# Patient Record
Sex: Female | Born: 1990 | Hispanic: Yes | Marital: Single | Smoking: Never smoker
Health system: Southern US, Community
[De-identification: ages and names within clinical notes are randomized; demographics above are authoritative.]

---

## 2012-08-10 ENCOUNTER — Ambulatory Visit: Payer: Self-pay | Admitting: Advanced Practice Midwife

## 2012-09-26 ENCOUNTER — Ambulatory Visit: Payer: Self-pay | Admitting: Family Medicine

## 2013-01-13 ENCOUNTER — Observation Stay: Payer: Self-pay

## 2013-02-17 ENCOUNTER — Ambulatory Visit: Payer: Self-pay | Admitting: Obstetrics and Gynecology

## 2013-02-17 LAB — CBC WITH DIFFERENTIAL/PLATELET
Basophil #: 0 10*3/uL (ref 0.0–0.1)
Basophil %: 0.1 %
HCT: 36.2 % (ref 35.0–47.0)
Lymphocyte #: 2.3 10*3/uL (ref 1.0–3.6)
Lymphocyte %: 17.9 %
MCHC: 33.3 g/dL (ref 32.0–36.0)
MCV: 80 fL (ref 80–100)
Neutrophil %: 72.9 %
RBC: 4.54 10*6/uL (ref 3.80–5.20)
RDW: 15.7 % — ABNORMAL HIGH (ref 11.5–14.5)
WBC: 13 10*3/uL — ABNORMAL HIGH (ref 3.6–11.0)

## 2013-02-18 ENCOUNTER — Inpatient Hospital Stay: Payer: Self-pay | Admitting: Obstetrics and Gynecology

## 2013-02-18 LAB — GC/CHLAMYDIA PROBE AMP

## 2013-02-19 LAB — HEMATOCRIT: HCT: 29.5 % — ABNORMAL LOW (ref 35.0–47.0)

## 2014-10-12 NOTE — Op Note (Signed)
PATIENT NAME:  Krista Farley, Adanna MR#:  [id number] DATE OF BIRTH:  12/27/1990  DATE OF PROCEDURE:  02/18/2013  PREOPERATIVE DIAGNOSES: 1.   Thirty-nine + 4 weeks. 2.   Breech presentation.   POSTOPERATIVE DIAGNOSES:  1.   Thirty-nine + 4 weeks. 2.   Breech presentation.   PROCEDURES: 1.  Primary low transverse cesarean section. 2.  Breech extraction.   ANESTHESIA:  Spinal.   SURGEON:  Suzy Bouchardhomas J Schermerhorn, M.D.   FIRST ASSISTANT:  _____ Phillips ClimesBrame.   INDICATIONS:  A 24 year old, gravida 1, para 0, patient noted to be in the breech position and reconfirmed the day of the procedure. The patient has elected for a primary cesarean section.   PROCEDURE:   After adequate spinal anesthesia, the patient was placed in the dorsal supine position with a hip roll under the right side. The patient received 2 grams IV Ancef prior to commencement of the case. A Pfannenstiel incision was made. Sharp dissection was used to identify the fascia. The fascia was opened in the midline and opened in a transverse fashion. The superior aspect of the fascia was grasped with Kocher clamps and the recti muscles dissected free. The inferior aspect of the fascia was grasped with Kocher clamps and the pyramidalis muscle was dissected free. Entry into the peritoneal cavity was accomplished sharply. The vesicouterine peritoneal fold was identified, and a bladder flap was created, and the bladder reflected inferiorly. A low transverse uterine incision was made. Upon entry into the endometrial cavity, clear fluid resulted. Fetal buttocks were brought to the incision followed by delivery of right and left leg and the arms were delivered without difficulty. A nuchal cord x 3 was identified and reduced, and the head was delivered without difficulty. Cord was doubly clamped and vigorous female was passed to be pediatric staff who assigned Apgar scores of 9 and 9. The placenta was manually delivered and the uterus was exteriorized.  The endometrial cavity was wiped clean with laparotomy tape. Intravenous Pitocin was administered. A ring forceps was used to open the cervix and this was passed off the operative field and the uterine incision was closed with 1 chromic suture in a running locking fashion with good approximation of edges. Good hemostasis was noted. Her fallopian tubes and ovaries appeared normal. The posterior cul-de-sac was irrigated and suctioned. The uterus was placed back into the abdominal cavity and the paracolic gutters were wiped clean with laparotomy tape. The uterine incision again appeared hemostatic. The uterine incision was then covered with Interceed in a T-shaped fashion. The fascia was closed with 0 Vicryl suture in a running nonlocking fashion with good approximation of edges. The subcutaneous tissues were irrigated and bovied for hemostasis, and the skin was reapproximated with Insorb absorbable staples. The patient tolerated the procedure well. Intraoperative fluids 2400 mL. Estimated blood loss 500 mL. The patient was taken to the Recovery Room in good condition.  ____________________________ Suzy Bouchardhomas J. Schermerhorn, MD tjs:jm D: 02/18/2013 09:31:00 ET T: 02/18/2013 09:57:19 ET JOB#: [id number]  cc: Suzy Bouchardhomas J. Schermerhorn, MD, <Dictator> Suzy BouchardHOMAS J SCHERMERHORN MD ELECTRONICALLY SIGNED 02/19/2013 12:18

## 2015-05-23 NOTE — H&P (Signed)
  Ms. Krista Farley is a 24 y.o. female here for repeat LTCS on 06/05/15. Pt is G2P1 at 37+5 weeks with EDC on 06/06/15 based on 11 week u/s .  Pr with a prior c/s by me 2013 . She has elected for repeat LTCS . Declines BTL and does not want the On Q pump    Past Medical History:  has no past medical history on file.  Past Surgical History:  has a past surgical history that includes Cesarean section. Family History: family history is not on file. Social History:  reports that she has never smoked. She does not have any smokeless tobacco history on file. She reports that she does not drink alcohol or use illicit drugs. OB/GYN History:  OB History    Gravida Para Term Preterm AB TAB SAB Ectopic Multiple Living   2 1 1       1       Allergies: has No Known Allergies. Medications:  Current Outpatient Prescriptions:  . prenatal vitamin-iron-FA (PRENATE PLUS) tablet, Take 1 tablet by mouth once daily., Disp: , Rfl:  . etonogestrel (NEXPLANON) 68 mg implant, Inject 1 each into the skin once., Disp: , Rfl:  . multivitamin tablet, Take 1 tablet by mouth once daily., Disp: , Rfl:  . rifampin (RIFADIN) 300 MG capsule, Take 600 mg by mouth once daily., Disp: , Rfl:   Review of Systems: General:   No fatigue or weight loss Eyes:   No vision changes Ears:   No hearing difficulty Respiratory:   No cough or shortness of breath Pulmonary:   No asthma or shortness of breath Cardiovascular:  No chest pain, palpitations, dyspnea on exertion Gastrointestinal:  No abdominal bloating, chronic diarrhea, constipations, masses, pain or hematochezia Genitourinary:  No hematuria, dysuria, abnormal vaginal discharge, pelvic pain, Menometrorrhagia Lymphatic:  No swollen lymph nodes Musculoskeletal: No muscle weakness Neurologic:  No extremity weakness, syncope, seizure disorder Psychiatric:  No history of depression, delusions or suicidal/homicidal ideation   Exam:    Vitals:   05/21/15 1421  BP: 106/72  Pulse: 89    Body mass index is 35.12 kg/(m^2).  WDWN hispanic  female in NAD  Lungs: CTA  CV : RRR without murmur   Neck: no thyromegaly Abdomen: soft , no mass, normal active bowel sounds, non-tender, no rebound tenderness Pelvic: tanner stage 5 ,  External genitalia: vulva /labia no lesions Urethra: no prolapse Vagina: normal physiologic d/c Cervix: no lesions, no cervical motion tenderness , closed / 70%/ -1 vtx   Uterus: normal size shape and contour, non-tender Adnexa: no mass, non-tender  Rectovaginal:  Impression:   Elective repeat LTCS .  39+6  Plan:  Declines BTL . No On q pump

## 2015-05-31 ENCOUNTER — Observation Stay: Payer: Medicaid Other | Admitting: Anesthesiology

## 2015-05-31 ENCOUNTER — Inpatient Hospital Stay
Admission: EM | Admit: 2015-05-31 | Discharge: 2015-06-03 | DRG: 766 | Disposition: A | Discharge status: Home / Self Care | Payer: Medicaid Other | Attending: Obstetrics and Gynecology | Admitting: Obstetrics and Gynecology

## 2015-05-31 ENCOUNTER — Encounter
Admission: EM | Disposition: A | Discharge status: Home / Self Care | Payer: Self-pay | Source: Home / Self Care | Attending: Obstetrics and Gynecology

## 2015-05-31 ENCOUNTER — Encounter: Payer: Self-pay | Admitting: Anesthesiology

## 2015-05-31 DIAGNOSIS — O4212 Full-term premature rupture of membranes, onset of labor more than 24 hours following rupture: Secondary | ICD-10-CM | POA: Diagnosis present

## 2015-05-31 DIAGNOSIS — O4290 Premature rupture of membranes, unspecified as to length of time between rupture and onset of labor, unspecified weeks of gestation: Secondary | ICD-10-CM | POA: Diagnosis present

## 2015-05-31 DIAGNOSIS — N858 Other specified noninflammatory disorders of uterus: Secondary | ICD-10-CM | POA: Diagnosis present

## 2015-05-31 DIAGNOSIS — O34211 Maternal care for low transverse scar from previous cesarean delivery: Principal | ICD-10-CM | POA: Diagnosis present

## 2015-05-31 DIAGNOSIS — Z3A39 39 weeks gestation of pregnancy: Secondary | ICD-10-CM

## 2015-05-31 DIAGNOSIS — O42119 Preterm premature rupture of membranes, onset of labor more than 24 hours following rupture, unspecified trimester: Secondary | ICD-10-CM

## 2015-05-31 SURGERY — Surgical Case
Anesthesia: Spinal | Wound class: Clean Contaminated

## 2015-05-31 MED ORDER — OXYTOCIN 40 UNITS IN LACTATED RINGERS INFUSION - SIMPLE MED
INTRAVENOUS | Status: AC
  Filled 2015-05-31: qty 1000

## 2015-05-31 MED ORDER — CITRIC ACID-SODIUM CITRATE 334-500 MG/5ML PO SOLN
ORAL | Status: AC
Start: 2015-05-31 — End: 2015-06-01
  Administered 2015-06-01: 30 mL
  Filled 2015-05-31: qty 15

## 2015-05-31 MED ORDER — LACTATED RINGERS IV SOLN
INTRAVENOUS | Status: DC
  Administered 2015-06-01 (×2): via INTRAVENOUS

## 2015-05-31 MED ORDER — DEXTROSE 5 % IV SOLN
3.0000 g | INTRAVENOUS | Status: AC
  Administered 2015-06-01: 3 g via INTRAVENOUS
  Filled 2015-05-31: qty 3000

## 2015-05-31 MED ORDER — CEFAZOLIN SODIUM-DEXTROSE 2-3 GM-% IV SOLR: 2.0000 g | INTRAVENOUS | Status: DC

## 2015-05-31 SURGICAL SUPPLY — 23 items
BENZOIN TINCTURE PRP APPL 2/3 (GAUZE/BANDAGES/DRESSINGS) ×3 IMPLANT
CANISTER SUCT 3000ML (MISCELLANEOUS) ×3 IMPLANT
CHLORAPREP W/TINT 26ML (MISCELLANEOUS) ×6 IMPLANT
CLOSURE WOUND 1/2 X4 (GAUZE/BANDAGES/DRESSINGS) ×1
DRSG TELFA 3X8 NADH (GAUZE/BANDAGES/DRESSINGS) ×3 IMPLANT
GAUZE SPONGE 4X4 12PLY STRL (GAUZE/BANDAGES/DRESSINGS) IMPLANT
GOWN STRL REUS W/ TWL LRG LVL3 (GOWN DISPOSABLE) ×3 IMPLANT
GOWN STRL REUS W/TWL LRG LVL3 (GOWN DISPOSABLE) ×6
NS IRRIG 1000ML POUR BTL (IV SOLUTION) ×3 IMPLANT
PAD ABD DERMACEA PRESS 5X9 (GAUZE/BANDAGES/DRESSINGS) ×6 IMPLANT
PAD GROUND ADULT SPLIT (MISCELLANEOUS) ×3 IMPLANT
PAD OB MATERNITY 4.3X12.25 (PERSONAL CARE ITEMS) ×3 IMPLANT
PAD PREP 24X41 OB/GYN DISP (PERSONAL CARE ITEMS) ×3 IMPLANT
SPONGE LAP 18X18 5 PK (GAUZE/BANDAGES/DRESSINGS) ×3 IMPLANT
STRIP CLOSURE SKIN 1/2X4 (GAUZE/BANDAGES/DRESSINGS) ×2 IMPLANT
SUT MAXON ABS #0 GS21 30IN (SUTURE) ×3 IMPLANT
SUT MNCRL 4-0 (SUTURE) ×2
SUT MNCRL 4-0 27XMFL (SUTURE) ×1
SUT PLAIN 2 0 XLH (SUTURE) ×3 IMPLANT
SUT PLAIN GUT 2-0 30 C14 SG823 (SUTURE) ×3
SUT VIC AB 0 CT1 36 (SUTURE) ×9 IMPLANT
SUTURE MNCRL 4-0 27XMF (SUTURE) ×1 IMPLANT
SUTURE PLN GUT2-0 30 C14 SG823 (SUTURE) ×1 IMPLANT

## 2015-05-31 NOTE — H&P (Signed)
OB ATTENDING NOTE:  LMP: 08/25/14 EDD: 06/06/15 by 11 wk sono   Ms. Krista Farley is a 24 y.o. female here for repeat LTCS. She had been scheduled for c/s on 06/05/15. She ruptured around 9pm. Occasional ctx.    APC: [facility] 1. H/o previous c/s 02/18/13 for frank breech - desires repeat - Done by Schermerhorn, patient requesting him again 2. 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  Labs:  10/30/14 - A+, ab neg, Hepbsag neg, RPR NR, HIV neg, UTOX neg, 12.5/37.3<262, RI, VZV I   Past Medical History:  has no past medical history on file.  Past Surgical History:  has a past surgical history that includes Cesarean section. Family History: family history is not on file. Social History:  reports that she has never smoked. She does not have any smokeless tobacco history on file. She reports that she does not drink alcohol or use illicit drugs. OB/GYN History:  OB History    Gravida Para Term Preterm AB TAB SAB Ectopic Multiple Living   2 1 1       1       Allergies: has No Known Allergies. Medications:  Current Outpatient Prescriptions:  . prenatal vitamin-iron-FA (PRENATE PLUS) tablet, Take 1 tablet by mouth once daily., Disp: , Rfl:  . etonogestrel (NEXPLANON) 68 mg implant, Inject 1 each into the skin once., Disp: , Rfl:  . multivitamin tablet, Take 1 tablet by mouth once daily., Disp: , Rfl:  . rifampin (RIFADIN) 300 MG capsule, Take 600 mg by mouth once daily., Disp: , Rfl:   Review of Systems: General:   No fatigue or weight loss Eyes:   No vision changes Ears:   No hearing difficulty Respiratory:   No cough or shortness of breath Pulmonary:   No asthma or shortness of breath Cardiovascular:  No chest pain, palpitations, dyspnea on exertion Gastrointestinal:  No abdominal bloating, chronic diarrhea, constipations, masses, pain or hematochezia Genitourinary:  No hematuria, dysuria, abnormal vaginal discharge, pelvic pain,  Menometrorrhagia Lymphatic:  No swollen lymph nodes Musculoskeletal: No muscle weakness Neurologic:  No extremity weakness, syncope, seizure disorder Psychiatric:  No history of depression, delusions or suicidal/homicidal ideation   Exam:   Vitals:   05/21/15 1421  BP: 106/72  Pulse: 89    Body mass index is 35.12 kg/(m^2).  WDWN hispanic  female in NAD  Lungs: CTA  CV : RRR without murmur   Neck: no thyromegaly Abdomen: soft , no mass, normal active bowel sounds, non-tender, no rebound tenderness Pelvic: tanner stage 5 ,  External genitalia: vulva /labia no lesions Urethra: no prolapse Vagina: normal physiologic d/c Cervix: no lesions, no cervical motion tenderness , closed / 70%/ -1 vtx   Uterus: normal size shape and contour, non-tender Adnexa: no mass, non-tender   SVE: 1/long/high, grossly ruptured  EFM: 150 mod var, +accels, no decel Toco: Q806min  Impression:   Elective repeat LTCS .  39+1  Plan:  1.Repeat C/S - r/b/a of CS discussed with the patient including risk of bleeding/pain/infection/injury to bowel, bladder or surrounding organs, risk of wound infection or wound separation. All questions answered. Patient elects for repeat C/S.  - Consent forms signed - blood transfusion and surgical consent - Declines BTL . No On q pump  To OR when available.   Ala DachJohanna K Halfon, MD

## 2015-05-31 NOTE — Anesthesia Preprocedure Evaluation (Deleted)
Anesthesia Evaluation  Patient identified by MRN, date of birth, ID band Patient awake    Reviewed: Allergy & Precautions, NPO status , Patient's Chart, lab work & pertinent test results, reviewed documented beta blocker date and time   Airway Mallampati: II  TM Distance: >3 FB     Dental  (+) Chipped   Pulmonary           Cardiovascular      Neuro/Psych    GI/Hepatic   Endo/Other    Renal/GU      Musculoskeletal   Abdominal   Peds  Hematology   Anesthesia Other Findings   Reproductive/Obstetrics                             Anesthesia Physical Anesthesia Plan  ASA: II  Anesthesia Plan: Spinal   Post-op Pain Management:    Induction:   Airway Management Planned:   Additional Equipment:   Intra-op Plan:   Post-operative Plan:   Informed Consent: I have reviewed the patients History and Physical, chart, labs and discussed the procedure including the risks, benefits and alternatives for the proposed anesthesia with the patient or authorized representative who has indicated his/her understanding and acceptance.     Plan Discussed with: CRNA  Anesthesia Plan Comments:         Anesthesia Quick Evaluation  

## 2015-06-01 ENCOUNTER — Inpatient Hospital Stay: Payer: Medicaid Other | Admitting: Certified Registered Nurse Anesthetist

## 2015-06-01 ENCOUNTER — Encounter: Payer: Self-pay | Admitting: *Deleted

## 2015-06-01 DIAGNOSIS — O42119 Preterm premature rupture of membranes, onset of labor more than 24 hours following rupture, unspecified trimester: Secondary | ICD-10-CM

## 2015-06-01 DIAGNOSIS — O4212 Full-term premature rupture of membranes, onset of labor more than 24 hours following rupture: Secondary | ICD-10-CM | POA: Diagnosis present

## 2015-06-01 DIAGNOSIS — Z3A39 39 weeks gestation of pregnancy: Secondary | ICD-10-CM | POA: Diagnosis not present

## 2015-06-01 DIAGNOSIS — O34211 Maternal care for low transverse scar from previous cesarean delivery: Secondary | ICD-10-CM | POA: Diagnosis present

## 2015-06-01 DIAGNOSIS — N858 Other specified noninflammatory disorders of uterus: Secondary | ICD-10-CM | POA: Diagnosis present

## 2015-06-01 LAB — TYPE AND SCREEN
ABO/RH(D): A POS
Antibody Screen: NEGATIVE

## 2015-06-01 LAB — CBC
HCT: 38.7 % (ref 35.0–47.0)
HEMATOCRIT: 34.4 % — AB (ref 35.0–47.0)
Hemoglobin: 11.3 g/dL — ABNORMAL LOW (ref 12.0–16.0)
Hemoglobin: 13.1 g/dL (ref 12.0–16.0)
MCH: 27.1 pg (ref 26.0–34.0)
MCH: 27.4 pg (ref 26.0–34.0)
MCHC: 32.9 g/dL (ref 32.0–36.0)
MCHC: 33.9 g/dL (ref 32.0–36.0)
MCV: 80.9 fL (ref 80.0–100.0)
MCV: 82.2 fL (ref 80.0–100.0)
PLATELETS: 189 10*3/uL (ref 150–440)
Platelets: 157 10*3/uL (ref 150–440)
RBC: 4.19 MIL/uL (ref 3.80–5.20)
RBC: 4.79 MIL/uL (ref 3.80–5.20)
RDW: 15.7 % — AB (ref 11.5–14.5)
RDW: 15.9 % — ABNORMAL HIGH (ref 11.5–14.5)
WBC: 12.3 10*3/uL — ABNORMAL HIGH (ref 3.6–11.0)
WBC: 16.6 10*3/uL — ABNORMAL HIGH (ref 3.6–11.0)

## 2015-06-01 LAB — BASIC METABOLIC PANEL
Anion gap: 8 (ref 5–15)
BUN: 12 mg/dL (ref 6–20)
CHLORIDE: 107 mmol/L (ref 101–111)
CO2: 21 mmol/L — ABNORMAL LOW (ref 22–32)
CREATININE: 0.49 mg/dL (ref 0.44–1.00)
Calcium: 9.5 mg/dL (ref 8.9–10.3)
GFR calc Af Amer: >60 mL/min (ref >60)
GLUCOSE: 86 mg/dL (ref 65–99)
POTASSIUM: 3.7 mmol/L (ref 3.5–5.1)
SODIUM: 136 mmol/L (ref 135–145)

## 2015-06-01 LAB — ABO/RH: ABO/RH(D): A POS

## 2015-06-01 MED ORDER — OXYCODONE-ACETAMINOPHEN 5-325 MG PO TABS
1.0000 | ORAL_TABLET | ORAL | Status: DC | PRN
  Administered 2015-06-03: 1 via ORAL
  Filled 2015-06-01: qty 1

## 2015-06-01 MED ORDER — MEPERIDINE HCL 25 MG/ML IJ SOLN: 6.2500 mg | INTRAMUSCULAR | Status: DC | PRN

## 2015-06-01 MED ORDER — LIDOCAINE 5 % EX PTCH
MEDICATED_PATCH | CUTANEOUS | Status: AC
  Filled 2015-06-01: qty 1

## 2015-06-01 MED ORDER — SIMETHICONE 80 MG PO CHEW: 80.0000 mg | CHEWABLE_TABLET | ORAL | Status: DC | PRN

## 2015-06-01 MED ORDER — NALOXONE HCL 2 MG/2ML IJ SOSY
1.0000 ug/kg/h | PREFILLED_SYRINGE | INTRAVENOUS | Status: DC | PRN
  Filled 2015-06-01: qty 2

## 2015-06-01 MED ORDER — PRENATAL MULTIVITAMIN CH
1.0000 | ORAL_TABLET | Freq: Every day | ORAL | Status: DC
  Administered 2015-06-02 – 2015-06-03 (×2): 1 via ORAL
  Filled 2015-06-01 (×2): qty 1

## 2015-06-01 MED ORDER — WITCH HAZEL-GLYCERIN EX PADS: 1.0000 "application " | MEDICATED_PAD | CUTANEOUS | Status: DC | PRN

## 2015-06-01 MED ORDER — LIDOCAINE 5 % EX PTCH: 1.0000 | MEDICATED_PATCH | CUTANEOUS | Status: DC

## 2015-06-01 MED ORDER — SODIUM CHLORIDE 0.9 % IJ SOLN: 3.0000 mL | INTRAMUSCULAR | Status: DC | PRN

## 2015-06-01 MED ORDER — DIPHENHYDRAMINE HCL 25 MG PO CAPS
25.0000 mg | ORAL_CAPSULE | Freq: Four times a day (QID) | ORAL | Status: DC | PRN
  Administered 2015-06-01 (×3): 25 mg via ORAL
  Filled 2015-06-01 (×2): qty 1

## 2015-06-01 MED ORDER — LANOLIN HYDROUS EX OINT: 1.0000 "application " | TOPICAL_OINTMENT | CUTANEOUS | Status: DC | PRN

## 2015-06-01 MED ORDER — NALOXONE HCL 0.4 MG/ML IJ SOLN: 0.4000 mg | INTRAMUSCULAR | Status: DC | PRN

## 2015-06-01 MED ORDER — SIMETHICONE 80 MG PO CHEW: 80.0000 mg | CHEWABLE_TABLET | ORAL | Status: DC

## 2015-06-01 MED ORDER — LIDOCAINE 5 % EX PTCH
1.0000 | MEDICATED_PATCH | CUTANEOUS | Status: DC
  Administered 2015-06-02 – 2015-06-03 (×2): 1 via TRANSDERMAL
  Filled 2015-06-01 (×2): qty 1

## 2015-06-01 MED ORDER — BUPIVACAINE IN DEXTROSE 0.75-8.25 % IT SOLN
INTRATHECAL | Status: DC | PRN
  Administered 2015-06-01: 12 mg via INTRATHECAL

## 2015-06-01 MED ORDER — LACTATED RINGERS IV SOLN
INTRAVENOUS | Status: DC | PRN
  Administered 2015-06-01 (×2): via INTRAVENOUS

## 2015-06-01 MED ORDER — KETOROLAC TROMETHAMINE 30 MG/ML IJ SOLN: 30.0000 mg | Freq: Four times a day (QID) | INTRAMUSCULAR | Status: AC | PRN

## 2015-06-01 MED ORDER — OXYCODONE-ACETAMINOPHEN 5-325 MG PO TABS: 2.0000 | ORAL_TABLET | ORAL | Status: DC | PRN

## 2015-06-01 MED ORDER — ONDANSETRON HCL 4 MG/2ML IJ SOLN: 4.0000 mg | Freq: Three times a day (TID) | INTRAMUSCULAR | Status: DC | PRN

## 2015-06-01 MED ORDER — ACETAMINOPHEN 325 MG PO TABS: 650.0000 mg | ORAL_TABLET | ORAL | Status: DC | PRN

## 2015-06-01 MED ORDER — DIPHENHYDRAMINE HCL 25 MG PO CAPS
ORAL_CAPSULE | ORAL | Status: AC
  Filled 2015-06-01: qty 1

## 2015-06-01 MED ORDER — NALBUPHINE HCL 10 MG/ML IJ SOLN: 5.0000 mg | Freq: Once | INTRAMUSCULAR | Status: DC | PRN

## 2015-06-01 MED ORDER — IBUPROFEN 600 MG PO TABS
600.0000 mg | ORAL_TABLET | Freq: Four times a day (QID) | ORAL | Status: DC
  Administered 2015-06-01 – 2015-06-03 (×8): 600 mg via ORAL
  Filled 2015-06-01 (×8): qty 1

## 2015-06-01 MED ORDER — TETANUS-DIPHTH-ACELL PERTUSSIS 5-2.5-18.5 LF-MCG/0.5 IM SUSP: 0.5000 mL | Freq: Once | INTRAMUSCULAR | Status: DC

## 2015-06-01 MED ORDER — KETOROLAC TROMETHAMINE 30 MG/ML IJ SOLN
30.0000 mg | Freq: Four times a day (QID) | INTRAMUSCULAR | Status: AC | PRN
  Administered 2015-06-01: 30 mg via INTRAVENOUS
  Filled 2015-06-01 (×2): qty 1

## 2015-06-01 MED ORDER — OXYTOCIN 40 UNITS IN LACTATED RINGERS INFUSION - SIMPLE MED
INTRAVENOUS | Status: AC
  Administered 2015-06-01: 40 [IU]
  Filled 2015-06-01: qty 1000

## 2015-06-01 MED ORDER — SCOPOLAMINE 1 MG/3DAYS TD PT72
1.0000 | MEDICATED_PATCH | Freq: Once | TRANSDERMAL | Status: DC
Start: 2015-06-01 — End: 2015-06-03

## 2015-06-01 MED ORDER — ZOLPIDEM TARTRATE 5 MG PO TABS: 5.0000 mg | ORAL_TABLET | Freq: Every evening | ORAL | Status: DC | PRN

## 2015-06-01 MED ORDER — DIBUCAINE 1 % RE OINT: 1.0000 "application " | TOPICAL_OINTMENT | RECTAL | Status: DC | PRN

## 2015-06-01 MED ORDER — SENNOSIDES-DOCUSATE SODIUM 8.6-50 MG PO TABS
2.0000 | ORAL_TABLET | ORAL | Status: DC
  Administered 2015-06-02 – 2015-06-03 (×2): 2 via ORAL
  Filled 2015-06-01 (×2): qty 2

## 2015-06-01 MED ORDER — EPHEDRINE SULFATE 50 MG/ML IJ SOLN
INTRAMUSCULAR | Status: DC | PRN
Start: 2015-06-01 — End: 2015-06-01
  Administered 2015-06-01 (×2): 10 mg via INTRAVENOUS
  Administered 2015-06-01 (×2): 5 mg via INTRAVENOUS

## 2015-06-01 MED ORDER — NALBUPHINE HCL 10 MG/ML IJ SOLN: 5.0000 mg | INTRAMUSCULAR | Status: DC | PRN

## 2015-06-01 MED ORDER — OXYTOCIN 40 UNITS IN LACTATED RINGERS INFUSION - SIMPLE MED: 62.5000 mL/h | INTRAVENOUS | Status: AC

## 2015-06-01 MED ORDER — MENTHOL 3 MG MT LOZG
1.0000 | LOZENGE | OROMUCOSAL | Status: DC | PRN
  Filled 2015-06-01: qty 9

## 2015-06-01 MED ORDER — MORPHINE SULFATE (PF) 0.5 MG/ML IJ SOLN
INTRAMUSCULAR | Status: DC | PRN
  Administered 2015-06-01: .2 mg via EPIDURAL

## 2015-06-01 MED ORDER — SIMETHICONE 80 MG PO CHEW
80.0000 mg | CHEWABLE_TABLET | Freq: Three times a day (TID) | ORAL | Status: DC
  Administered 2015-06-02 – 2015-06-03 (×5): 80 mg via ORAL
  Filled 2015-06-01 (×5): qty 1

## 2015-06-01 MED ORDER — LACTATED RINGERS IV SOLN
INTRAVENOUS | Status: DC
  Administered 2015-06-01: 21:00:00 via INTRAVENOUS

## 2015-06-01 NOTE — Op Note (Signed)
  Cesarean Section Procedure Note  Date of procedure: 06/01/2015   Pre-operative Diagnosis: Intrauterine pregnancy at 1436w2d; previous c/s, for elective repeat  Post-operative Diagnosis: same, delivered.  Procedure: Low Transverse Cesarean Section through Pfannenstiel incision  Surgeon: Burnett CorrenteJohanna Halfon, MD  Assistant(s):  none  Anesthesia: Spinal anesthesia  Estimated Blood Loss:  800         Drains: none          Total IV Fluids: 2350ml  Urine Output: 100ml         Specimens: none         Complications:  None; patient tolerated the procedure well.         Disposition: PACU - hemodynamically stable.         Condition: stable  Findings:  A female infant in LOP presentation. Amniotic fluid - Clear  Birth weight 3100 g.  Apgars of 8 and 9 at one and five minutes respectively.  Intact placenta with a three-vessel cord.  Grossly normal uterus, tubes and ovaries bilaterally. Minimal intraabdominal adhesions were noted.  Indications: previous uterine incision LTCS  Procedure Details  The patient was taken to Operating Room, identified as the correct patient and the procedure verified as C-Section Delivery. A formal Time Out was held with all team members present and in agreement.  After induction of anesthesia, the patient was draped and prepped in the usual sterile manner. A Pfannenstiel skin incision was made and carried down through the subcutaneous tissue to the fascia. Fascial incision was made and extended transversely with the Mayo scissors. The fascia was separated from the underlying rectus tissue superiorly and inferiorly. The peritoneum was identified and entered bluntly. Peritoneal incision was extended longitudinally. The utero-vesical peritoneal reflection was incised transversely and a bladder flap was created digitally.   A low transverse hysterotomy was made. The fetus was delivered atraumatically. The umbilical cord was clamped x2 and cut and the infant was  handed to the awaiting pediatricians. The placenta was removed intact and appeared normal, intact, and with a 3-vessel cord.   The uterus was exteriorized and cleared of all clot and debris. The hysterotomy was closed with running sutures of 0-Vicryl. A second imbricating layer was placed with the same suture. Excellent hemostasis was observed. The peritoneal cavity was cleared of all clots and debris. The uterus was returned to the abdomen.   The pelvis was irrigated and again, excellent hemostasis was noted. The fascia was then reapproximated with running sutures of 0 Maxon. The subcutaneous tissue was reapproximated with running sutures of 2-0 plain x 2 layers. The skin was reapproximated with a 4-0 Monocryl subcuticular stitch. Lidocaine patches were placed above and below the incision.   Instrument, sponge, and needle counts were correct prior to the abdominal closure and at the conclusion of the case.   The patient tolerated the procedure well and was transferred to the recovery room in stable condition.   Ala DachJohanna K Halfon, MD 06/01/2015

## 2015-06-01 NOTE — Addendum Note (Signed)
Addendum  created 06/01/15 1601 by Naomie DeanWilliam K Kephart, MD   Modules edited: Notes Section   Notes Section:  File: [id number]

## 2015-06-01 NOTE — Anesthesia Preprocedure Evaluation (Signed)
Anesthesia Evaluation  Patient identified by MRN, date of birth, ID band Patient awake    Reviewed: Allergy & Precautions, NPO status , Patient's Chart, lab work & pertinent test results, reviewed documented beta blocker date and time   Airway Mallampati: II  TM Distance: >3 FB     Dental  (+) Chipped   Pulmonary          Cardiovascular     Neuro/Psych    GI/Hepatic   Endo/Other    Renal/GU      Musculoskeletal   Abdominal   Peds  Hematology   Anesthesia Other Findings   Reproductive/Obstetrics                             Anesthesia Physical Anesthesia Plan  ASA: II  Anesthesia Plan: Spinal   Post-op Pain Management:    Induction:   Airway Management Planned: Nasal Cannula  Additional Equipment:   Intra-op Plan:   Post-operative Plan:   Informed Consent: I have reviewed the patients History and Physical, chart, labs and discussed the procedure including the risks, benefits and alternatives for the proposed anesthesia with the patient or authorized representative who has indicated his/her understanding and acceptance.     Plan Discussed with: CRNA  Anesthesia Plan Comments:         Anesthesia Quick Evaluation  

## 2015-06-01 NOTE — Anesthesia Procedure Notes (Addendum)
Date/Time: 06/01/2015 1:02 AM Performed by: Derinda LateIACONE, RONALD Pre-anesthesia Checklist: Timeout performed, Patient being monitored, Suction available and Emergency Drugs available Patient Re-evaluated:Patient Re-evaluated prior to inductionOxygen Delivery Method: Nasal cannula   Spinal Patient location during procedure: OR Staffing Anesthesiologist: Berdine AddisonHOMAS, Lutie Pickler Performed by: anesthesiologist  Preanesthetic Checklist Completed: patient identified, site marked, surgical consent, pre-op evaluation, timeout performed, IV checked and risks and benefits discussed Spinal Block Patient position: sitting Prep: Betadine Patient monitoring: heart rate, cardiac monitor, continuous pulse ox and blood pressure Approach: midline Location: L3-4 Injection technique: single-shot Needle Needle type: Pencil-Tip  Needle gauge: 25 G Needle length: 9 cm Assessment Sensory level: T8 Additional Notes [id number] marcaine 12.0 and 0.2 mg astromorph.

## 2015-06-01 NOTE — Progress Notes (Signed)
Abd. Dressing and Lidoderm Patches removed as per order. Pt. Tolerated well.

## 2015-06-01 NOTE — Plan of Care (Signed)
Problem: Education: Goal: Knowledge of condition will improve Outcome: Progressing By [facility] of [facility].  Problem: Coping: Goal: Ability to cope will improve Outcome: Progressing BY way Of Hospital Interpreter  Problem: Nutritional: Goal: Dietary intake will improve Outcome: Completed/Met Date Met:  06/01/15 Tolerating Reg. Diet by way of [facility].  Problem: Pain Management: Goal: General experience of comfort will improve and pain level will decrease Outcome: Progressing Tolerating P.O Pain Medication.  Problem: Bowel/Gastric: Goal: Gastrointestinal status will improve Outcome: Progressing Passing gas  Problem: Skin Integrity: Goal: Demonstration of wound healing without infection will improve Outcome: Progressing Incision well approximated without s/s complications.

## 2015-06-01 NOTE — Transfer of Care (Signed)
Immediate Anesthesia Transfer of Care Note  Patient: Krista Farley  Procedure(s) Performed: Procedure(s): CESAREAN SECTION (N/A)  Patient Location: PACU and Women's Unit  Anesthesia Type:Spinal  Level of Consciousness: awake, alert  and oriented  Airway & Oxygen Therapy: Patient Spontanous Breathing  Post-op Assessment: Report given to RN and Post -op Vital signs reviewed and stable  Post vital signs: Reviewed and stable  Last Vitals:  Filed Vitals:   05/31/15 2258 06/01/15 0207  BP: 126/88 128/91  Pulse: 90   Temp: 37.1 C 36.4 C  Resp: 22     Complications: No apparent anesthesia complications

## 2015-06-01 NOTE — Anesthesia Post-op Follow-up Note (Signed)
  Anesthesia Pain Follow-up Note  Patient: Krista Farley  Day #: 1  Date of Follow-up: 06/01/2015 Time: 4:00 PM  Last Vitals:  Filed Vitals:   06/01/15 0802 06/01/15 1226  BP: 130/86 126/77  Pulse: 88 93  Temp: 37.2 C 36.8 C  Resp: 18 18    Level of Consciousness: alert  Pain: mild   Side Effects:Pruritis  Plan: D/C from anesthesia care  KEPHART,WILLIAM K

## 2015-06-01 NOTE — Anesthesia Postprocedure Evaluation (Signed)
Anesthesia Post Note  Patient: Krista Farley  Procedure(s) Performed: Procedure(s) (LRB): CESAREAN SECTION (N/A)  Patient location during evaluation: PACU Level of consciousness: awake Pain management: pain level controlled Vital Signs Assessment: post-procedure vital signs reviewed and stable Respiratory status: spontaneous breathing Cardiovascular status: blood pressure returned to baseline Anesthetic complications: no    Last Vitals:  Filed Vitals:   06/01/15 0802 06/01/15 1226  BP: 130/86 126/77  Pulse: 88 93  Temp: 37.2 C 36.8 C  Resp: 18 18    Last Pain:  Filed Vitals:   06/01/15 1226  PainSc: 1                  Daven Montz S

## 2015-06-02 LAB — RPR: RPR Ser Ql: NONREACTIVE

## 2015-06-02 NOTE — Progress Notes (Signed)
Subjective: Postpartum Day 1: Cesarean Delivery Patient reports no problems voiding.    Objective: Vital signs in last 24 hours: Temp:  [97.7 F (36.5 C)-99.3 F (37.4 C)] 98 F (36.7 C) (12/11 0752) Pulse Rate:  [79-100] 79 (12/11 0752) Resp:  [18-19] 18 (12/11 0752) BP: (101-126)/(71-78) 109/75 mmHg (12/11 0752) SpO2:  [99 %-100 %] 99 % (12/11 0752)  Physical Exam:  General: alert and cooperative  Lungs cta  cv rrr Lochia: appropriate Uterine Fundus: firm Incision: healing well DVT Evaluation: No evidence of DVT seen on physical exam.   Recent Labs  05/31/15 2359 06/01/15 0514  HGB 13.1 11.3*  HCT 38.7 34.4*    Assessment/Plan: Status post Cesarean section. Doing well postoperatively.  Continue current care.  SCHERMERHORN,THOMAS 06/02/2015, 10:51 AM

## 2015-06-03 ENCOUNTER — Encounter: Payer: Self-pay | Admitting: Obstetrics and Gynecology

## 2015-06-03 MED ORDER — OXYCODONE-ACETAMINOPHEN 5-325 MG PO TABS: 1.0000 | ORAL_TABLET | ORAL | Status: DC | PRN

## 2015-06-03 MED ORDER — IBUPROFEN 600 MG PO TABS: 600.0000 mg | ORAL_TABLET | Freq: Four times a day (QID) | ORAL | Status: DC

## 2015-06-03 NOTE — Discharge Summary (Signed)
Obstetric Discharge Summary Reason for Admission: onset of labor Prenatal Procedures: none Intrapartum Procedures: cesarean: low cervical, transverse Postpartum Procedures: none Complications-Operative and Postpartum: none HEMOGLOBIN  Date Value Ref Range Status  06/01/2015 11.3* 12.0 - 16.0 g/dL Final   HGB  Date Value Ref Range Status  02/17/2013 12.0 12.0-16.0 g/dL Final   HCT  Date Value Ref Range Status  06/01/2015 34.4* 35.0 - 47.0 % Final  02/19/2013 29.5* 35.0-47.0 % Final    Physical Exam:  General: alert and cooperative Lochia: appropriate Uterine Fundus: firm Incision: healing well DVT Evaluation: No evidence of DVT seen on physical exam.  Discharge Diagnoses: Term Pregnancy-delivered  Discharge Information: Date: 06/03/2015 Activity: pelvic rest Diet: routine Medications: Ibuprofen and Percocet Condition: stable Instructions: refer to practice specific booklet Discharge to: home Follow-up Information    Follow up with Ala DachJohanna K Halfon, MD In 2 weeks.   Specialty:  Obstetrics and Gynecology   Why:  For wound re-check   Contact information:   [address] [place] [address] [phone]       Newborn Data: Live born female  Birth Weight: 6 lb 13.4 oz (3100 g) APGAR: 8, 9  Home with mother.  SCHERMERHORN,THOMAS 06/03/2015, 4:29 PM

## 2015-06-03 NOTE — Progress Notes (Signed)
Patient discharged home with infant. Discharge instructions, prescriptions and follow up appointment given to and reviewed with patient with interpreter present. Patient verbalized understanding. Escorted out with infant via wheelchair by Janean SarkMary Lee, NT.

## 2015-06-04 ENCOUNTER — Other Ambulatory Visit: Payer: Self-pay

## 2015-06-05 ENCOUNTER — Encounter: Admission: RE | Payer: Self-pay | Source: Ambulatory Visit

## 2015-06-05 ENCOUNTER — Inpatient Hospital Stay
Admission: RE | Admit: 2015-06-05 | Payer: Medicaid Other | Source: Ambulatory Visit | Admitting: Obstetrics and Gynecology

## 2015-06-05 SURGERY — Surgical Case
Anesthesia: Choice

## 2017-04-23 ENCOUNTER — Other Ambulatory Visit (HOSPITAL_COMMUNITY): Payer: Self-pay | Admitting: Family Medicine

## 2017-04-23 DIAGNOSIS — Z369 Encounter for antenatal screening, unspecified: Secondary | ICD-10-CM

## 2017-04-29 ENCOUNTER — Ambulatory Visit: Payer: Self-pay

## 2017-11-05 ENCOUNTER — Encounter
Admission: RE | Admit: 2017-11-05 | Discharge: 2017-11-05 | Disposition: A | Discharge status: Home / Self Care | Payer: Medicaid Other | Source: Ambulatory Visit | Attending: Obstetrics and Gynecology | Admitting: Obstetrics and Gynecology

## 2017-11-05 ENCOUNTER — Other Ambulatory Visit: Payer: Self-pay

## 2017-11-05 DIAGNOSIS — Z01818 Encounter for other preprocedural examination: Secondary | ICD-10-CM | POA: Diagnosis not present

## 2017-11-05 NOTE — H&P (Signed)
Krista Farley is a 27 y.o. female Krista Farley for Rf by Arnetha Courser, CNM-Delivery plans .pt with [facility] 11/13/17 based on LMP and confirmatory u/s . Pt with 2 prior c/s and elects for repat and BTL . 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  Past Surgical History:  has a past surgical history that includes Cesarean section (2014 2016). Family History: family history includes No Known Problems in her father and mother. Social History:  reports that she has never smoked. She has never used smokeless tobacco. She reports that she does not drink alcohol or use drugs. OB/GYN History:          OB History    Gravida  3   Para  2   Term  2   Preterm      AB      Living  2     SAB      TAB      Ectopic      Molar      Multiple      Live Births  2          Allergies: has No Known Allergies. Medications:  Current Outpatient Medications:  .  prenatal vitamin-iron-FA (PRENATE PLUS) tablet, Take 1 tablet by mouth once daily., Disp: , Rfl:   Review of Systems: General:                      No fatigue or weight loss Eyes:                           No vision changes Ears:                            No hearing difficulty Respiratory:                No cough or shortness of breath Pulmonary:                  No asthma or shortness of breath Cardiovascular:           No chest pain, palpitations, dyspnea on exertion Gastrointestinal:          No abdominal bloating, chronic diarrhea, constipations, masses, pain or hematochezia Genitourinary:             No hematuria, dysuria, abnormal vaginal discharge, pelvic pain, Menometrorrhagia Lymphatic:                   No swollen lymph nodes Musculoskeletal:         No muscle weakness Neurologic:                  No extremity weakness, syncope, seizure disorder Psychiatric:                   No history of depression, delusions or suicidal/homicidal ideation    Exam:      Vitals:   10/19/17 1441  BP: 97/63  Pulse: 85    Body mass index is 34.93 kg/m.  WDWN hispanic  female in NAD   Lungs: CTA  CV : RRR without murmur   Neck:  no thyromegaly Abdomen: soft , no mass, normal active bowel sounds,  non-tender, no rebound tenderness  Impression:   The primary encounter diagnosis was Previous cesarean section. A diagnosis of Encounter for sterilization was also pertinent to this visit.    Plan:    She will continue to follow up with [facility]  Benefits and risks to surgery: The proposed benefit of the surgery has been discussed with the patient. The possible risks include, but are not limited to: organ injury to the bowel , bladder, ureters, and major blood vessels and nerves. There is a possibility of additional surgeries resulting from these injuries. There is also the risk of blood transfusion and the need to receive blood products during or after the procedure which may rarely lead to HIV or Hepatitis C infection. There is a risk of developing a deep venous thrombosis or a pulmonary embolism . There is the possibility of wound infection and also anesthetic complications, even the rare possibility of death. The patient understands these risks and wishes to proceed. All questions have been answered and the consent has been signed.

## 2017-11-05 NOTE — Patient Instructions (Addendum)
Your procedure is scheduled on: Monday, Nov 08, 2017 Report to the 3rd floor Birth Place at 5:30 am. Go through the emergency room entrance and they will direct you to the birth place.  REMEMBER: Instructions that are not followed completely may result in serious medical risk, up to and including death; or upon the discretion of your surgeon and anesthesiologist your surgery may need to be rescheduled.  Do not eat or drink after midnight the night before your procedure.  No gum chewing, lozengers or hard candies.  No Alcohol for 24 hours before or after surgery.  No Smoking including e-cigarettes for 24 hours prior to surgery.  No chewable tobacco products for at least 6 hours prior to surgery.  No nicotine patches on the day of surgery.  On the morning of surgery brush your teeth with toothpaste and water, you may rinse your mouth with mouthwash if you wish. Do not swallow any toothpaste or mouthwash.  Notify your doctor if there is any change in your medical condition (cold, fever, infection).  Do not wear jewelry, make-up, hairpins, clips or nail polish.  Do not wear lotions, powders, or perfumes. You may wear deodorant.  Do not shave 48 hours prior to surgery.   Contacts and dentures may not be worn into surgery.  Do not bring valuables to the hospital, including drivers license, insurance or credit cards.  [facility] is not responsible for any belongings or valuables.   TAKE THESE MEDICATIONS THE MORNING OF SURGERY:  none  Use CHG  wipes as directed on instruction sheet.  NOW!  Stop Anti-inflammatories (NSAIDS) such as Advil, Aleve, Ibuprofen, Motrin, Naproxen, Naprosyn and Aspirin based products such as Excedrin, Goodys Powder, BC Powder. (May take Tylenol or Acetaminophen if needed.)  NOW!  Stop ANY OVER THE COUNTER supplements until after surgery. (May continue multivitamin.)  Wear comfortable clothing (specific to your surgery type) to the hospital.  Plan for  stool softeners for home use.  If you are being admitted to the hospital overnight, leave your suitcase in the car. After surgery it may be brought to your room.  Please call [phone] if you have any questions about these instructions.

## 2017-11-07 MED ORDER — CEFAZOLIN SODIUM-DEXTROSE 2-4 GM/100ML-% IV SOLN
2.0000 g | INTRAVENOUS | Status: DC
  Filled 2017-11-07: qty 100

## 2017-11-08 ENCOUNTER — Inpatient Hospital Stay
Admission: RE | Admit: 2017-11-08 | Discharge: 2017-11-10 | DRG: 785 | Disposition: A | Discharge status: Home / Self Care | Payer: Medicaid Other | Source: Ambulatory Visit | Attending: Obstetrics and Gynecology | Admitting: Obstetrics and Gynecology

## 2017-11-08 ENCOUNTER — Inpatient Hospital Stay: Payer: Medicaid Other | Admitting: Anesthesiology

## 2017-11-08 ENCOUNTER — Encounter: Payer: Self-pay | Admitting: *Deleted

## 2017-11-08 ENCOUNTER — Encounter
Admission: RE | Disposition: A | Discharge status: Home / Self Care | Payer: Self-pay | Source: Ambulatory Visit | Attending: Obstetrics and Gynecology

## 2017-11-08 ENCOUNTER — Other Ambulatory Visit: Payer: Self-pay

## 2017-11-08 DIAGNOSIS — O34211 Maternal care for low transverse scar from previous cesarean delivery: Principal | ICD-10-CM | POA: Diagnosis present

## 2017-11-08 DIAGNOSIS — Z302 Encounter for sterilization: Secondary | ICD-10-CM | POA: Diagnosis not present

## 2017-11-08 DIAGNOSIS — Z3490 Encounter for supervision of normal pregnancy, unspecified, unspecified trimester: Secondary | ICD-10-CM

## 2017-11-08 DIAGNOSIS — Z3A39 39 weeks gestation of pregnancy: Secondary | ICD-10-CM | POA: Diagnosis not present

## 2017-11-08 DIAGNOSIS — O99214 Obesity complicating childbirth: Secondary | ICD-10-CM | POA: Diagnosis present

## 2017-11-08 DIAGNOSIS — E669 Obesity, unspecified: Secondary | ICD-10-CM | POA: Diagnosis present

## 2017-11-08 DIAGNOSIS — O0990 Supervision of high risk pregnancy, unspecified, unspecified trimester: Secondary | ICD-10-CM

## 2017-11-08 LAB — BASIC METABOLIC PANEL
ANION GAP: 8 (ref 5–15)
BUN: 8 mg/dL (ref 6–20)
CALCIUM: 8.4 mg/dL — AB (ref 8.9–10.3)
CHLORIDE: 105 mmol/L (ref 101–111)
CO2: 22 mmol/L (ref 22–32)
CREATININE: 0.5 mg/dL (ref 0.44–1.00)
Glucose, Bld: 89 mg/dL (ref 65–99)
Potassium: 3.6 mmol/L (ref 3.5–5.1)
SODIUM: 135 mmol/L (ref 135–145)

## 2017-11-08 LAB — CBC
HEMATOCRIT: 33.2 % — AB (ref 35.0–47.0)
HEMOGLOBIN: 11.4 g/dL — AB (ref 12.0–16.0)
MCH: 27.5 pg (ref 26.0–34.0)
MCHC: 34.4 g/dL (ref 32.0–36.0)
MCV: 80 fL (ref 80.0–100.0)
Platelets: 202 10*3/uL (ref 150–440)
RBC: 4.15 MIL/uL (ref 3.80–5.20)
RDW: 14.9 % — ABNORMAL HIGH (ref 11.5–14.5)
WBC: 11.2 10*3/uL — AB (ref 3.6–11.0)

## 2017-11-08 LAB — TYPE AND SCREEN
ABO/RH(D): A POS
Antibody Screen: NEGATIVE

## 2017-11-08 LAB — RAPID HIV SCREEN (HIV 1/2 AB+AG)
HIV 1/2 ANTIBODIES: NONREACTIVE
HIV-1 P24 Antigen - HIV24: NONREACTIVE

## 2017-11-08 SURGERY — Surgical Case
Anesthesia: Spinal | Site: Abdomen | Laterality: Bilateral | Wound class: Clean Contaminated

## 2017-11-08 MED ORDER — KETOROLAC TROMETHAMINE 30 MG/ML IJ SOLN
30.0000 mg | Freq: Four times a day (QID) | INTRAMUSCULAR | Status: AC | PRN
  Filled 2017-11-08: qty 1

## 2017-11-08 MED ORDER — LACTATED RINGERS IV SOLN
INTRAVENOUS | Status: DC
  Administered 2017-11-08 (×2): via INTRAVENOUS

## 2017-11-08 MED ORDER — MORPHINE SULFATE (PF) 0.5 MG/ML IJ SOLN
INTRAMUSCULAR | Status: AC
  Filled 2017-11-08: qty 10

## 2017-11-08 MED ORDER — PHENYLEPHRINE HCL 10 MG/ML IJ SOLN
INTRAMUSCULAR | Status: DC | PRN
  Administered 2017-11-08: 100 ug via INTRAVENOUS
  Administered 2017-11-08 (×3): 200 ug via INTRAVENOUS

## 2017-11-08 MED ORDER — BUPIVACAINE HCL (PF) 0.25 % IJ SOLN
INTRAMUSCULAR | Status: DC | PRN
  Administered 2017-11-08: 30 mL

## 2017-11-08 MED ORDER — NALOXONE HCL 0.4 MG/ML IJ SOLN
0.4000 mg | INTRAMUSCULAR | Status: DC | PRN
  Filled 2017-11-08: qty 1

## 2017-11-08 MED ORDER — PRENATAL MULTIVITAMIN CH
1.0000 | ORAL_TABLET | Freq: Every day | ORAL | Status: DC
  Administered 2017-11-08 – 2017-11-10 (×3): 1 via ORAL
  Filled 2017-11-08 (×3): qty 1

## 2017-11-08 MED ORDER — IBUPROFEN 600 MG PO TABS
600.0000 mg | ORAL_TABLET | Freq: Four times a day (QID) | ORAL | Status: DC
  Administered 2017-11-08 – 2017-11-10 (×8): 600 mg via ORAL
  Filled 2017-11-08 (×8): qty 1

## 2017-11-08 MED ORDER — OXYCODONE-ACETAMINOPHEN 5-325 MG PO TABS
2.0000 | ORAL_TABLET | ORAL | Status: DC | PRN
  Administered 2017-11-09 – 2017-11-10 (×2): 2 via ORAL
  Filled 2017-11-08 (×2): qty 2

## 2017-11-08 MED ORDER — SOD CITRATE-CITRIC ACID 500-334 MG/5ML PO SOLN
30.0000 mL | ORAL | Status: DC
  Filled 2017-11-08: qty 15

## 2017-11-08 MED ORDER — DIPHENHYDRAMINE HCL 25 MG PO CAPS: 25.0000 mg | ORAL_CAPSULE | ORAL | Status: DC | PRN

## 2017-11-08 MED ORDER — OXYTOCIN 40 UNITS IN LACTATED RINGERS INFUSION - SIMPLE MED
INTRAVENOUS | Status: AC
  Filled 2017-11-08: qty 1000

## 2017-11-08 MED ORDER — KETOROLAC TROMETHAMINE 30 MG/ML IJ SOLN
30.0000 mg | Freq: Four times a day (QID) | INTRAMUSCULAR | Status: AC | PRN
  Administered 2017-11-08: 30 mg via INTRAVENOUS
  Filled 2017-11-08 (×2): qty 1

## 2017-11-08 MED ORDER — FENTANYL CITRATE (PF) 100 MCG/2ML IJ SOLN: 25.0000 ug | INTRAMUSCULAR | Status: DC | PRN

## 2017-11-08 MED ORDER — ONDANSETRON HCL 4 MG/2ML IJ SOLN
INTRAMUSCULAR | Status: DC | PRN
  Administered 2017-11-08: 4 mg via INTRAVENOUS

## 2017-11-08 MED ORDER — NALBUPHINE HCL 10 MG/ML IJ SOLN
5.0000 mg | INTRAMUSCULAR | Status: DC | PRN
  Filled 2017-11-08: qty 0.5

## 2017-11-08 MED ORDER — EPHEDRINE SULFATE 50 MG/ML IJ SOLN
INTRAMUSCULAR | Status: DC | PRN
  Administered 2017-11-08 (×2): 10 mg via INTRAVENOUS

## 2017-11-08 MED ORDER — COCONUT OIL OIL: 1.0000 "application " | TOPICAL_OIL | Status: DC | PRN

## 2017-11-08 MED ORDER — DIPHENHYDRAMINE HCL 50 MG/ML IJ SOLN: 12.5000 mg | INTRAMUSCULAR | Status: DC | PRN

## 2017-11-08 MED ORDER — OXYTOCIN 10 UNIT/ML IJ SOLN
INTRAVENOUS | Status: DC | PRN
  Administered 2017-11-08: 40 [IU] via INTRAVENOUS

## 2017-11-08 MED ORDER — SENNOSIDES-DOCUSATE SODIUM 8.6-50 MG PO TABS
2.0000 | ORAL_TABLET | ORAL | Status: DC
  Administered 2017-11-09 – 2017-11-10 (×2): 2 via ORAL
  Filled 2017-11-08 (×2): qty 2

## 2017-11-08 MED ORDER — WITCH HAZEL-GLYCERIN EX PADS: 1.0000 "application " | MEDICATED_PAD | CUTANEOUS | Status: DC | PRN

## 2017-11-08 MED ORDER — BISACODYL 10 MG RE SUPP: 10.0000 mg | Freq: Every day | RECTAL | Status: DC | PRN

## 2017-11-08 MED ORDER — TETANUS-DIPHTH-ACELL PERTUSSIS 5-2.5-18.5 LF-MCG/0.5 IM SUSP: 0.5000 mL | Freq: Once | INTRAMUSCULAR | Status: DC

## 2017-11-08 MED ORDER — OXYCODONE HCL 5 MG PO TABS: 5.0000 mg | ORAL_TABLET | ORAL | Status: DC | PRN

## 2017-11-08 MED ORDER — MENTHOL 3 MG MT LOZG
1.0000 | LOZENGE | OROMUCOSAL | Status: DC | PRN
  Filled 2017-11-08: qty 9

## 2017-11-08 MED ORDER — ONDANSETRON HCL 4 MG/2ML IJ SOLN: 4.0000 mg | Freq: Once | INTRAMUSCULAR | Status: DC | PRN

## 2017-11-08 MED ORDER — OXYCODONE-ACETAMINOPHEN 5-325 MG PO TABS
1.0000 | ORAL_TABLET | ORAL | Status: DC | PRN
  Administered 2017-11-09 (×2): 1 via ORAL
  Filled 2017-11-08 (×2): qty 1

## 2017-11-08 MED ORDER — SODIUM CHLORIDE 0.9% FLUSH: 3.0000 mL | INTRAVENOUS | Status: DC | PRN

## 2017-11-08 MED ORDER — SIMETHICONE 80 MG PO CHEW
80.0000 mg | CHEWABLE_TABLET | ORAL | Status: DC
  Filled 2017-11-08 (×2): qty 1

## 2017-11-08 MED ORDER — LACTATED RINGERS IV SOLN
Freq: Once | INTRAVENOUS | Status: AC
  Administered 2017-11-08: 07:00:00 via INTRAVENOUS

## 2017-11-08 MED ORDER — FLEET ENEMA 7-19 GM/118ML RE ENEM: 1.0000 | ENEMA | Freq: Every day | RECTAL | Status: DC | PRN

## 2017-11-08 MED ORDER — FENTANYL CITRATE (PF) 100 MCG/2ML IJ SOLN
INTRAMUSCULAR | Status: DC | PRN
  Administered 2017-11-08: 15 ug via INTRATHECAL

## 2017-11-08 MED ORDER — MEASLES, MUMPS & RUBELLA VAC ~~LOC~~ INJ
0.5000 mL | INJECTION | Freq: Once | SUBCUTANEOUS | Status: DC
  Filled 2017-11-08: qty 0.5

## 2017-11-08 MED ORDER — NALBUPHINE HCL 10 MG/ML IJ SOLN
5.0000 mg | INTRAMUSCULAR | Status: DC | PRN
  Administered 2017-11-08: 5 mg via INTRAVENOUS
  Filled 2017-11-08: qty 1
  Filled 2017-11-08: qty 0.5
  Filled 2017-11-08: qty 1

## 2017-11-08 MED ORDER — MORPHINE SULFATE (PF) 0.5 MG/ML IJ SOLN
INTRAMUSCULAR | Status: DC | PRN
  Administered 2017-11-08: .1 mg via INTRATHECAL

## 2017-11-08 MED ORDER — SODIUM CHLORIDE 0.9 % IV SOLN
INTRAVENOUS | Status: DC | PRN
  Administered 2017-11-08: 70 mL

## 2017-11-08 MED ORDER — SIMETHICONE 80 MG PO CHEW
80.0000 mg | CHEWABLE_TABLET | Freq: Three times a day (TID) | ORAL | Status: DC
  Administered 2017-11-08 – 2017-11-10 (×5): 80 mg via ORAL
  Filled 2017-11-08 (×6): qty 1

## 2017-11-08 MED ORDER — PROPOFOL 10 MG/ML IV BOLUS
INTRAVENOUS | Status: AC
  Filled 2017-11-08: qty 40

## 2017-11-08 MED ORDER — SIMETHICONE 80 MG PO CHEW
80.0000 mg | CHEWABLE_TABLET | ORAL | Status: DC | PRN
  Administered 2017-11-10: 80 mg via ORAL

## 2017-11-08 MED ORDER — NALBUPHINE HCL 10 MG/ML IJ SOLN
5.0000 mg | Freq: Once | INTRAMUSCULAR | Status: AC | PRN
  Administered 2017-11-08: 5 mg via INTRAVENOUS
  Filled 2017-11-08: qty 0.5

## 2017-11-08 MED ORDER — NALBUPHINE HCL 10 MG/ML IJ SOLN
5.0000 mg | Freq: Once | INTRAMUSCULAR | Status: AC | PRN
  Filled 2017-11-08: qty 0.5

## 2017-11-08 MED ORDER — BUPIVACAINE HCL (PF) 0.5 % IJ SOLN
INTRAMUSCULAR | Status: AC
  Filled 2017-11-08: qty 30

## 2017-11-08 MED ORDER — ACETAMINOPHEN 325 MG PO TABS
650.0000 mg | ORAL_TABLET | ORAL | Status: DC | PRN
  Administered 2017-11-08 – 2017-11-09 (×2): 650 mg via ORAL
  Filled 2017-11-08 (×2): qty 2

## 2017-11-08 MED ORDER — LACTATED RINGERS IV SOLN
INTRAVENOUS | Status: DC
  Administered 2017-11-09: 02:00:00 via INTRAVENOUS

## 2017-11-08 MED ORDER — NALOXONE HCL 4 MG/10ML IJ SOLN
1.0000 ug/kg/h | INTRAVENOUS | Status: DC | PRN
  Filled 2017-11-08: qty 5

## 2017-11-08 MED ORDER — FENTANYL CITRATE (PF) 100 MCG/2ML IJ SOLN
INTRAMUSCULAR | Status: AC
  Filled 2017-11-08: qty 2

## 2017-11-08 MED ORDER — SODIUM CHLORIDE 0.9 % IJ SOLN
INTRAMUSCULAR | Status: AC
  Filled 2017-11-08: qty 10

## 2017-11-08 MED ORDER — DIBUCAINE 1 % RE OINT: 1.0000 "application " | TOPICAL_OINTMENT | RECTAL | Status: DC | PRN

## 2017-11-08 MED ORDER — ONDANSETRON HCL 4 MG/2ML IJ SOLN: 4.0000 mg | Freq: Three times a day (TID) | INTRAMUSCULAR | Status: DC | PRN

## 2017-11-08 MED ORDER — CEFAZOLIN SODIUM-DEXTROSE 2-3 GM-%(50ML) IV SOLR
INTRAVENOUS | Status: DC | PRN
  Administered 2017-11-08: 2 g via INTRAVENOUS

## 2017-11-08 MED ORDER — OXYTOCIN 40 UNITS IN LACTATED RINGERS INFUSION - SIMPLE MED: 2.5000 [IU]/h | INTRAVENOUS | Status: DC

## 2017-11-08 MED ORDER — BUPIVACAINE LIPOSOME 1.3 % IJ SUSP
20.0000 mL | Freq: Once | INTRAMUSCULAR | Status: DC
  Filled 2017-11-08: qty 20

## 2017-11-08 MED ORDER — MEPERIDINE HCL 50 MG/ML IJ SOLN: 6.2500 mg | INTRAMUSCULAR | Status: DC | PRN

## 2017-11-08 MED ORDER — LACTATED RINGERS IV SOLN: INTRAVENOUS | Status: DC

## 2017-11-08 MED ORDER — BUPIVACAINE IN DEXTROSE 0.75-8.25 % IT SOLN
INTRATHECAL | Status: DC | PRN
  Administered 2017-11-08: 1.6 mL via INTRATHECAL

## 2017-11-08 SURGICAL SUPPLY — 34 items
BARRIER ADHS 3X4 INTERCEED (GAUZE/BANDAGES/DRESSINGS) ×3 IMPLANT
CANISTER SUCT 3000ML PPV (MISCELLANEOUS) ×3 IMPLANT
CHLORAPREP W/TINT 26ML (MISCELLANEOUS) ×3 IMPLANT
CLOSURE WOUND 1/2 X4 (GAUZE/BANDAGES/DRESSINGS) ×1
DRSG TELFA 3X8 NADH (GAUZE/BANDAGES/DRESSINGS) ×6 IMPLANT
ELECT REM PT RETURN 9FT ADLT (ELECTROSURGICAL) ×3
ELECTRODE REM PT RTRN 9FT ADLT (ELECTROSURGICAL) ×1 IMPLANT
GAUZE SPONGE 4X4 12PLY STRL (GAUZE/BANDAGES/DRESSINGS) ×6 IMPLANT
GLOVE BIOGEL PI IND STRL 7.5 (GLOVE) ×1 IMPLANT
GLOVE BIOGEL PI INDICATOR 7.5 (GLOVE) ×2
GLOVE SKINSENSE NS SZ7.5 (GLOVE) ×2
GLOVE SKINSENSE STRL SZ7.5 (GLOVE) ×1 IMPLANT
GLOVE SURG SYN 7.0 (GLOVE) ×3 IMPLANT
GOWN STRL REUS W/ TWL LRG LVL3 (GOWN DISPOSABLE) ×3 IMPLANT
GOWN STRL REUS W/TWL LRG LVL3 (GOWN DISPOSABLE) ×6
NEEDLE HYPO 22GX1.5 SAFETY (NEEDLE) ×6 IMPLANT
NS IRRIG 1000ML POUR BTL (IV SOLUTION) ×3 IMPLANT
PAD OB MATERNITY 4.3X12.25 (PERSONAL CARE ITEMS) ×3 IMPLANT
PAD PREP 24X41 OB/GYN DISP (PERSONAL CARE ITEMS) ×3 IMPLANT
RTRCTR C-SECT PINK 25CM LRG (MISCELLANEOUS) ×3 IMPLANT
SPONGE LAP 18X18 5 PK (GAUZE/BANDAGES/DRESSINGS) ×3 IMPLANT
STAPLER INSORB 30 2030 C-SECTI (MISCELLANEOUS) ×3 IMPLANT
STRIP CLOSURE SKIN 1/2X4 (GAUZE/BANDAGES/DRESSINGS) ×2 IMPLANT
SUT MNCRL 4-0 (SUTURE)
SUT MNCRL 4-0 27XMFL (SUTURE)
SUT PDS AB 1 TP1 96 (SUTURE) ×3 IMPLANT
SUT PLAIN 2 0 XLH (SUTURE) ×6 IMPLANT
SUT PLAIN GUT 2-0 30 C14 SG823 (SUTURE) ×3
SUT VIC AB 0 CT1 36 (SUTURE) ×9 IMPLANT
SUT VIC AB 3-0 SH 27 (SUTURE) ×2
SUT VIC AB 3-0 SH 27X BRD (SUTURE) ×1 IMPLANT
SUTURE MNCRL 4-0 27XMF (SUTURE) IMPLANT
SUTURE PLN GUT2-0 30 C14 SG823 (SUTURE) ×1 IMPLANT
SYR 30ML LL (SYRINGE) ×6 IMPLANT

## 2017-11-08 NOTE — Discharge Instructions (Signed)

## 2017-11-08 NOTE — Op Note (Signed)
Cesarean Section Procedure Note  Indications: repeat cesarean x3  Pre-operative Diagnosis:  1. Intrauterine pregnancy at [redacted]w[redacted]d;  2. Desires permanent sterilization  Post-operative Diagnosis: same, delivered.  Procedure: 1. Repeat Low Transverse Cesarean Section through Pfannenstiel incision  2. Bilateral tubal sterilization using modified Parkland method with removal of fimbriated ends  Surgeon: [name], MD  Assistant(s):  Milon Score, CNM  Anesthesia: Spinal anesthesia  Estimated Blood Loss:  500         Drains: n/a         Total IV Fluids:  Urine Output:         Specimens: left and right portion of the tube         Complications:  None; patient tolerated the procedure well.         Disposition: PACU - hemodynamically stable.         Condition: stable  Findings:  A female infant in cephalic presentation. Amniotic fluid - Clear  Birth weight 2910 g.  Apgars of 9 and 9.   Intact placenta with a three-vessel cord.  Grossly normal uterus, tubes and ovaries bilaterally. Moderated intraabdominal adhesions were noted.  Procedure Details  The patient was taken to Operating Room, identified as the correct patient and the procedure verified as C-Section Delivery. A Time Out was held and the above information confirmed.  After induction of anesthesia, the patient was draped and prepped in the usual sterile manner. A Pfannenstiel incision was made and carried down through the subcutaneous tissue to the fascia. Fascial incision was made and extended transversely with the Mayo scissors. The fascia was separated from the underlying rectus tissue superiorly and inferiorly. The peritoneum was identified and entered bluntly. Peritoneal incision was extended longitudinally. The utero-vesical peritoneal reflection was incised transversely and a bladder flap was created digitally.   A low transverse hysterotomy was made. The fetus was delivered atraumatically. The  umbilical cord was clamped x2 and cut and the infant was handed to the awaiting pediatricians. The placenta was removed intact and appeared normal with a 3-vessel cord.   The uterus was exteriorized and cleared of all clot and debris. The hysterotomy was closed with running sutures of  0-Vicryl. A second imbricating layer was placed with the same suture. Excellent hemostasis was observed.   Attention was then turned to the tubal ligation. The left fallopian tube distinguished from the round ligament by identifying the fimbria and was grasped with a Babcock clamp in the midisthmic portion approximately 3 cm from the cornual region. It was then doubly ligated with 0-plain gut suture in a Parkland fashion. The tubal segment was excised with Metzenbaum scissors. Tubal ostea noted. Fimbriated ends removed with plain gut as well. The procedure was repeated on the right side. *Care was noted to examine both tubal sites in situ to ensure the sutures were intact and no bleeding was noted. The uterus was returned to the abdomen.  The pelvis was irrigated and again, excellent hemostasis was noted. The fascia was then reapproximated with running sutures of 0 Vicryl.  The subcutaneous tissue was reapproximated with running sutures of chromic. The skin was reapproximated with Insorb absorbable staples.  Instrument, sponge, and needle counts were correct prior to the abdominal closure and at the conclusion of the case.   The patient tolerated the procedure well and was transferred to the recovery room in stable condition.   [name], MD5/20/2019

## 2017-11-08 NOTE — Anesthesia Preprocedure Evaluation (Signed)
Anesthesia Evaluation  Patient identified by MRN, date of birth, ID band Patient awake    Reviewed: Allergy & Precautions, NPO status , Patient's Chart, lab work & pertinent test results, reviewed documented beta blocker date and time   History of Anesthesia Complications Negative for: history of anesthetic complications  Airway Mallampati: II  TM Distance: >3 FB     Dental  (+) Chipped, Dental Advidsory Given   Pulmonary neg pulmonary ROS,           Cardiovascular Exercise Tolerance: Good negative cardio ROS       Neuro/Psych negative neurological ROS     GI/Hepatic Neg liver ROS, GERD  ,  Endo/Other  negative endocrine ROS  Renal/GU negative Renal ROS     Musculoskeletal   Abdominal   Peds  Hematology   Anesthesia Other Findings History reviewed. No pertinent past medical history.   Reproductive/Obstetrics (+) Pregnancy                             Anesthesia Physical  Anesthesia Plan  ASA: II  Anesthesia Plan: Spinal   Post-op Pain Management:    Induction:   PONV Risk Score and Plan: 2  Airway Management Planned: Nasal Cannula  Additional Equipment:   Intra-op Plan:   Post-operative Plan:   Informed Consent: I have reviewed the patients History and Physical, chart, labs and discussed the procedure including the risks, benefits and alternatives for the proposed anesthesia with the patient or authorized representative who has indicated his/her understanding and acceptance.     Plan Discussed with: CRNA  Anesthesia Plan Comments:         Anesthesia Quick Evaluation

## 2017-11-08 NOTE — Anesthesia Post-op Follow-up Note (Signed)
Anesthesia QCDR form completed.        

## 2017-11-08 NOTE — ED Triage Notes (Signed)
Pt arrived for scheduled c-section; due 11/13/17; [facility]; G3P2; denies complications; positive fetal movement; denies bleeding; denies leaking fluid; denies abd pain

## 2017-11-08 NOTE — Interval H&P Note (Signed)
History and Physical Interval Note:  11/08/2017 7:37 AM  Krista Farley  has presented today for surgery, with the diagnosis of repeat c-section  The various methods of treatment have been discussed with the patient and family. After consideration of risks, benefits and other options for treatment, the patient has consented to  Procedure(s): CESAREAN SECTION WITH BILATERAL TUBAL LIGATION (Bilateral) as a surgical intervention .  The patient's history has been reviewed, patient examined, no change in status, stable for surgery.  I have reviewed the patient's chart and labs.  Questions were answered to the patient's satisfaction.     [name]

## 2017-11-08 NOTE — Anesthesia Procedure Notes (Signed)
Spinal  Patient location during procedure: OR Start time: 11/08/2017 8:16 AM End time: 11/08/2017 8:17 AM Staffing Anesthesiologist: Karenz, Andrew, MD Performed: anesthesiologist  Preanesthetic Checklist Completed: patient identified, site marked, surgical consent, pre-op evaluation, timeout performed, IV checked, risks and benefits discussed and monitors and equipment checked Spinal Block Patient position: sitting Prep: ChloraPrep Patient monitoring: heart rate, continuous pulse ox, blood pressure and cardiac monitor Approach: midline Location: L2-3 Injection technique: single-shot Needle Needle type: Whitacre and Introducer  Needle gauge: 24 G Needle length: 9 cm Assessment Sensory level: T10 Additional Notes Negative paresthesia. Negative blood return. Positive free-flowing CSF. Expiration date of kit checked and confirmed. Patient tolerated procedure well, without complications.       

## 2017-11-08 NOTE — Transfer of Care (Signed)
Immediate Anesthesia Transfer of Care Note  Patient: Krista Farley  Procedure(s) Performed: CESAREAN SECTION WITH BILATERAL TUBAL LIGATION (Bilateral Abdomen)  Patient Location: PACU  Anesthesia Type:Spinal  Level of Consciousness: awake  Airway & Oxygen Therapy: Patient Spontanous Breathing  Post-op Assessment: Report given to RN  Post vital signs: stable  Last Vitals:  Vitals Value Taken Time  BP    Temp    Pulse    Resp    SpO2      Last Pain:  Vitals:   11/08/17 0715  TempSrc:   PainSc: 0-No pain         Complications: No apparent anesthesia complications

## 2017-11-08 NOTE — Discharge Summary (Signed)
Obstetrical Discharge Summary  Patient Name: Krista Farley DOB: [birth year] MRN: [id number]  Date of Admission: 11/08/2017 Date of Discharge: 11/10/2017  Primary OB: [facility] Gestational Age at Delivery: [redacted]w[redacted]d   Antepartum complications:  - Hx of positive PPD, incompletely treated with rifampin in 12/2013 BMI >30  Admitting Diagnosis: scheduled cesarean delivery Secondary Diagnosis: Patient Active Problem List   Diagnosis Date Noted  . Pregnancy 11/08/2017  . Supervision of high-risk pregnancy 11/08/2017  . Premature rupture of membranes (PROM) at term with onset of labor after 24 hours, antepartum 06/01/2015  . Premature rupture of membranes in pregnancy 05/31/2015    Complications: None Intrapartum complications/course:  Repeat LTCS BTL  Date of Delivery: 11/08/17 Delivered By: [name] Delivery Type: repeat cesarean section, low transverse incision Anesthesia: spinal Newborn Data: Live born female  Birth Weight: 6 lb 6.7 oz (2910 g) APGAR: 8, 8  Newborn Delivery   Birth date/time:  11/08/2017 08:41:00 Delivery type:  C-Section, Low Transverse Trial of labor:  No C-section categorization:  Repeat       Discharge Physical Exam: 11/10/2017 at 1:49 PM  BP 118/82 (BP Location: Left Arm)   Pulse 74   Temp 98 F (36.7 C) (Oral)   Resp 20   Ht  (1.626 m)   Wt 190 lb (86.2 kg)   LMP 02/06/2017   SpO2 98%   BMI 32.61 kg/m   General: NAD CV: RRR Pulm: CTABL, nl effort ABD: s/nd/nt, fundus firm and below the umbilicus Lochia: moderate Incision: well approximated, no erythema or drainage.  DVT Evaluation: LE non-ttp, no evidence of DVT on exam.  Hemoglobin  Date Value Ref Range Status  11/09/2017 10.5 (L) 12.0 - 16.0 g/dL Final   HGB  Date Value Ref Range Status  02/17/2013 12.0 12.0 - 16.0 g/dL Final   HCT  Date Value Ref Range Status  11/09/2017 30.8 (L) 35.0 - 47.0 % Final  02/19/2013 29.5 (L) 35.0 - 47.0 % Final    Post partum  course: Patient had an uncomplicated postpartum course.  By time of discharge on POD# 2, her pain was controlled on oral pain medications; she had appropriate lochia and was ambulating, voiding without difficulty and tolerating regular diet.  She was deemed stable for discharge to home.     Postpartum Procedures: none Disposition: stable, discharge to home. Baby Feeding: formula Baby Disposition: home with mom  Rh Immune globulin given:n/a Rubella vaccine given: n/a Tdap vaccine given in AP or PP setting: 08/19/2017 Flu vaccine given in AP or PP setting: 04/2017  Contraception: BTL  Prenatal Labs: Blood type/Rh --/--/A POS (05/20 0615)  Antibody screen neg  Rubella Immune  Varicella Immune  RPR NR  HBsAg Neg  HIV NR  GC neg  Chlamydia neg  Genetic screening negative  1 hour GTT 89  3 hour GTT n/a  GBS negative     Plan:  Krista Farley was discharged to home in good condition. Follow-up appointment at [facility] with delivering provider in 2 weeks   Discharge Medications: Allergies as of 11/10/2017   No Known Allergies     Medication List    TAKE these medications   acetaminophen 325 MG tablet Commonly known as:  TYLENOL Take 2 tablets (650 mg total) by mouth every 4 (four) hours. What changed:    how much to take  when to take this  reasons to take this   bisacodyl 10 MG suppository Commonly known as:  DULCOLAX Place  1 suppository (10 mg total) rectally daily as needed for moderate constipation.   ibuprofen 600 MG tablet Commonly known as:  ADVIL,MOTRIN Take 1 tablet (600 mg total) by mouth every 6 (six) hours.   oxycodone 5 MG capsule Commonly known as:  OXY-IR Take 1 capsule (5 mg total) by mouth every 6 (six) hours as needed for up to 5 days for pain.   prenatal multivitamin Tabs tablet Take 1 tablet by mouth daily at 3 pm. IN THE AFTERNOON.   senna-docusate 8.6-50 MG tablet Commonly known as:  Senokot-S Take 2 tablets by  mouth 2 (two) times daily.   simethicone 80 MG chewable tablet Commonly known as:  MYLICON Chew 1 tablet (80 mg total) by mouth 3 (three) times daily after meals.       Follow-up Information    [name], MD In 2 weeks.   Specialty:  Obstetrics and Gynecology Why:  For postop check Contact information: [address] [place] [address] [phone]           Signed: Heloise Ochoa A, CNM 11/10/2017 1:50 PM

## 2017-11-09 ENCOUNTER — Encounter: Payer: Self-pay | Admitting: Obstetrics and Gynecology

## 2017-11-09 LAB — CBC
HCT: 30.8 % — ABNORMAL LOW (ref 35.0–47.0)
HEMOGLOBIN: 10.5 g/dL — AB (ref 12.0–16.0)
MCH: 27.6 pg (ref 26.0–34.0)
MCHC: 33.9 g/dL (ref 32.0–36.0)
MCV: 81.3 fL (ref 80.0–100.0)
PLATELETS: 173 10*3/uL (ref 150–440)
RBC: 3.8 MIL/uL (ref 3.80–5.20)
RDW: 15.4 % — ABNORMAL HIGH (ref 11.5–14.5)
WBC: 9.2 10*3/uL (ref 3.6–11.0)

## 2017-11-09 LAB — SURGICAL PATHOLOGY

## 2017-11-09 LAB — RPR: RPR: NONREACTIVE

## 2017-11-09 NOTE — Anesthesia Post-op Follow-up Note (Signed)
  Anesthesia Pain Follow-up Note  Patient: Krista Farley  Day #: 1  Date of Follow-up: 11/09/2017 Time: 7:39 AM  Last Vitals:  Vitals:   11/09/17 0428 11/09/17 0732  BP: 110/78 94/80  Pulse: 69 75  Resp: 16 16  Temp: 36.6 C 36.6 C  SpO2: 99% 99%    Level of Consciousness: alert  Pain: none   Side Effects:None  Catheter Site Exam:clean, dry, no drainage     Plan: D/C from anesthesia care at surgeon's request  [name]

## 2017-11-09 NOTE — Progress Notes (Signed)
Subjective: Postpartum Day 1: Cesarean Delivery Patient reports nausea, not passing gas yet.    Objective: Vital signs in last 24 hours: Temp:  [97.8 F (36.6 C)-98.5 F (36.9 C)] 97.8 F (36.6 C) (05/21 1125) Pulse Rate:  [69-90] 77 (05/21 1125) Resp:  [16-18] 18 (05/21 1125) BP: (94-119)/(66-85) 105/66 (05/21 1125) SpO2:  [98 %-100 %] 100 % (05/21 1125)  Physical Exam:  General: alert, cooperative and appears stated age Lochia: appropriate Uterine Fundus: firm Incision: no significant drainage, no significant erythema, dressing still in place DVT Evaluation: No evidence of DVT seen on physical exam. Negative Homan's sign.  Recent Labs    11/08/17 0645 11/09/17 0525  HGB 11.4* 10.5*  HCT 33.2* 30.8*    Assessment/Plan: Status post Cesarean section. Doing well postoperatively.  Continue current care. Encourage ambulation Remove dressing  [name] 11/09/2017, 1:08 PM

## 2017-11-09 NOTE — Anesthesia Postprocedure Evaluation (Signed)
Anesthesia Post Note  Patient: Krista Farley  Procedure(s) Performed: CESAREAN SECTION WITH BILATERAL TUBAL LIGATION (Bilateral Abdomen)  Patient location during evaluation: Mother Baby Anesthesia Type: Spinal Level of consciousness: oriented and awake and alert Pain management: pain level controlled Vital Signs Assessment: post-procedure vital signs reviewed and stable Respiratory status: spontaneous breathing, respiratory function stable and patient connected to nasal cannula oxygen Cardiovascular status: blood pressure returned to baseline and stable Postop Assessment: no headache, no backache and no apparent nausea or vomiting Anesthetic complications: no     Last Vitals:  Vitals:   11/09/17 0428 11/09/17 0732  BP: 110/78 94/80  Pulse: 69 75  Resp: 16 16  Temp: 36.6 C 36.6 C  SpO2: 99% 99%    Last Pain:  Vitals:   11/09/17 0732  TempSrc: Oral  PainSc:                  [name]

## 2017-11-10 ENCOUNTER — Encounter: Payer: Self-pay | Admitting: Lactation Services

## 2017-11-10 MED ORDER — SIMETHICONE 80 MG PO CHEW: 80.0000 mg | CHEWABLE_TABLET | Freq: Three times a day (TID) | ORAL | 0 refills | Status: DC

## 2017-11-10 MED ORDER — ACETAMINOPHEN 325 MG PO TABS: 650.0000 mg | ORAL_TABLET | ORAL | Status: DC

## 2017-11-10 MED ORDER — BISACODYL 10 MG RE SUPP: 10.0000 mg | Freq: Every day | RECTAL | 0 refills | Status: DC | PRN

## 2017-11-10 MED ORDER — OXYCODONE HCL 5 MG PO CAPS: 5.0000 mg | ORAL_CAPSULE | Freq: Four times a day (QID) | ORAL | 0 refills | Status: AC | PRN

## 2017-11-10 MED ORDER — SENNOSIDES-DOCUSATE SODIUM 8.6-50 MG PO TABS: 2.0000 | ORAL_TABLET | Freq: Two times a day (BID) | ORAL | 0 refills | Status: DC

## 2017-11-10 MED ORDER — IBUPROFEN 600 MG PO TABS: 600.0000 mg | ORAL_TABLET | Freq: Four times a day (QID) | ORAL | 0 refills | Status: DC

## 2017-11-10 NOTE — Progress Notes (Signed)
Discharge instructions provided.  Pt and sig other verbalize understanding of all instructions and follow-up care.  Prescription given.  Pt discharged to home with infant at 1645 on 11/10/17 via wheelchair by CNA. Reynold Bowen, RN 11/10/2017 5:55 PM

## 2020-05-06 ENCOUNTER — Emergency Department: Payer: Worker's Compensation

## 2020-05-06 ENCOUNTER — Other Ambulatory Visit: Payer: Self-pay

## 2020-05-06 ENCOUNTER — Emergency Department
Admission: EM | Admit: 2020-05-06 | Discharge: 2020-05-06 | Disposition: A | Discharge status: Home / Self Care | Payer: Worker's Compensation | Attending: Emergency Medicine | Admitting: Emergency Medicine

## 2020-05-06 ENCOUNTER — Encounter: Payer: Self-pay | Admitting: Emergency Medicine

## 2020-05-06 DIAGNOSIS — S63502A Unspecified sprain of left wrist, initial encounter: Secondary | ICD-10-CM | POA: Diagnosis not present

## 2020-05-06 DIAGNOSIS — Y990 Civilian activity done for income or pay: Secondary | ICD-10-CM | POA: Diagnosis not present

## 2020-05-06 DIAGNOSIS — Y9389 Activity, other specified: Secondary | ICD-10-CM | POA: Diagnosis not present

## 2020-05-06 DIAGNOSIS — S6992XA Unspecified injury of left wrist, hand and finger(s), initial encounter: Secondary | ICD-10-CM | POA: Diagnosis present

## 2020-05-06 DIAGNOSIS — W010XXA Fall on same level from slipping, tripping and stumbling without subsequent striking against object, initial encounter: Secondary | ICD-10-CM | POA: Insufficient documentation

## 2020-05-06 DIAGNOSIS — Y929 Unspecified place or not applicable: Secondary | ICD-10-CM | POA: Insufficient documentation

## 2020-05-06 DIAGNOSIS — Z23 Encounter for immunization: Secondary | ICD-10-CM | POA: Insufficient documentation

## 2020-05-06 MED ORDER — OXYCODONE-ACETAMINOPHEN 5-325 MG PO TABS
1.0000 | ORAL_TABLET | Freq: Once | ORAL | Status: AC
  Administered 2020-05-06: 1 via ORAL
  Filled 2020-05-06: qty 1

## 2020-05-06 MED ORDER — TETANUS-DIPHTH-ACELL PERTUSSIS 5-2.5-18.5 LF-MCG/0.5 IM SUSY
0.5000 mL | PREFILLED_SYRINGE | Freq: Once | INTRAMUSCULAR | Status: AC
  Administered 2020-05-06: 0.5 mL via INTRAMUSCULAR
  Filled 2020-05-06: qty 0.5

## 2020-05-06 MED ORDER — IBUPROFEN 600 MG PO TABS: 600.0000 mg | ORAL_TABLET | Freq: Four times a day (QID) | ORAL | 0 refills | Status: DC | PRN
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
## 2020-05-06 NOTE — ED Notes (Signed)
Patient verbalizes understanding of discharge instructions. Opportunity for questioning and answers were provided. Armband removed by staff, pt discharged from ED. Ambulated out to lobby  

## 2020-05-06 NOTE — ED Provider Notes (Signed)
[facility] Emergency Department Provider Note  ____________________________________________  Time seen: Approximately 7:38 AM  I have reviewed the triage vital signs and the nursing notes.   HISTORY  Chief Complaint Hand Injury    HPI Krista Farley is a 29 y.o. female that presents to the emergency department for evaluation of left hand injury at work today.  Patient was stepping up on some pallets to pick up something at her job when her foot got caught, she lost her footing and her left wrist twisted.  She is having pain primarily at the base of her thumb.  She is able to move her fingers but it is painful.   History reviewed. No pertinent past medical history.  Patient Active Problem List   Diagnosis Date Noted  . Pregnancy 11/08/2017  . Supervision of high-risk pregnancy 11/08/2017  . Premature rupture of membranes (PROM) at term with onset of labor after 24 hours, antepartum 06/01/2015  . Premature rupture of membranes in pregnancy 05/31/2015    Past Surgical History:  Procedure Laterality Date  . CESAREAN SECTION    . CESAREAN SECTION N/A 05/31/2015   Procedure: CESAREAN SECTION;  Surgeon: Ala Dach, MD;  Location: [facility] ORS;  Service: Obstetrics;  Laterality: N/A;  . CESAREAN SECTION WITH BILATERAL TUBAL LIGATION Bilateral 11/08/2017   Procedure: CESAREAN SECTION WITH BILATERAL TUBAL LIGATION;  Surgeon: [name], MD;  Location: [facility] ORS;  Service: Obstetrics;  Laterality: Bilateral;  Birth: 08:41 Wt: 6 lb 7 oz Sex: Female    Prior to Admission medications   Medication Sig Start Date End Date Taking? Authorizing Provider  acetaminophen (TYLENOL) 325 MG tablet Take 2 tablets (650 mg total) by mouth every 4 (four) hours. 11/10/17   McVey, Prudencio Pair, CNM  bisacodyl (DULCOLAX) 10 MG suppository Place 1 suppository (10 mg total) rectally daily as needed for moderate constipation. 11/10/17   McVey, Prudencio Pair, CNM  ibuprofen  (ADVIL) 600 MG tablet Take 1 tablet (600 mg total) by mouth every 6 (six) hours as needed. 05/06/20   Enid Derry, PA-C  Prenatal Vit-Fe Fumarate-FA (PRENATAL MULTIVITAMIN) TABS tablet Take 1 tablet by mouth daily at 3 pm. IN THE AFTERNOON.    [provider]  senna-docusate (SENOKOT-S) 8.6-50 MG tablet Take 2 tablets by mouth 2 (two) times daily. 11/10/17   McVey, Prudencio Pair, CNM  simethicone (MYLICON) 80 MG chewable tablet Chew 1 tablet (80 mg total) by mouth 3 (three) times daily after meals. 11/10/17   McVey, Prudencio Pair, CNM    Allergies Patient has no known allergies.  History reviewed. No pertinent family history.  Social History Social History   Tobacco Use  . Smoking status: Never Smoker  . Smokeless tobacco: Never Used  Vaping Use  . Vaping Use: Never used  Substance Use Topics  . Alcohol use: No  . Drug use: No     Review of Systems  Cardiovascular: No chest pain. Respiratory: No SOB. Gastrointestinal: No abdominal pain.  No nausea, no vomiting.  Musculoskeletal: Positive for wrist pain. Skin: Negative for rash, abrasions, lacerations, ecchymosis. Neurological: Negative for headaches, numbness or tingling   ____________________________________________   PHYSICAL EXAM:  VITAL SIGNS: ED Triage Vitals  Enc Vitals Group     BP 05/06/20 0709 132/65     Pulse Rate 05/06/20 0709 79     Resp 05/06/20 0709 18     Temp 05/06/20 0709 98.7 F (37.1 C)     Temp Source 05/06/20 0709 Oral  SpO2 05/06/20 0709 100 %     Weight 05/06/20 0710 170 lb (77.1 kg)     Height 05/06/20 0710 5\' 4"  (1.626 m)     Head Circumference --      Peak Flow --      Pain Score 05/06/20 0715 7     Pain Loc --      Pain Edu? --      Excl. in GC? --       Constitutional: Alert and oriented. Well appearing and in no acute distress. Eyes: Conjunctivae are normal. PERRL. EOMI. Head: Atraumatic. ENT:      Ears:      Nose: No congestion/rhinnorhea.      Mouth/Throat: Mucous  membranes are moist.  Neck: No stridor.  Cardiovascular: Normal rate, regular rhythm.  Good peripheral circulation. Symmetric radial pulses. Respiratory: Normal respiratory effort without tachypnea or retractions. Lungs CTAB. Good air entry to the bases with no decreased or absent breath sounds. Gastrointestinal: Bowel sounds 4 quadrants. Soft and nontender to palpation. No guarding or rigidity. No palpable masses. No distention.  Musculoskeletal: Full range of motion to all extremities. No gross deformities appreciated. Neurologic:  Normal speech and language. No gross focal neurologic deficits are appreciated.  Skin:  Skin is warm, dry.  Small abrasion to left radial wrist over the thumb. Psychiatric: Mood and affect are normal. Speech and behavior are normal. Patient exhibits appropriate insight and judgement.   ____________________________________________   LABS (all labs ordered are listed, but only abnormal results are displayed)  Labs Reviewed - No data to display ____________________________________________  EKG   ____________________________________________  RADIOLOGY 05/08/20, personally viewed and evaluated these images (plain radiographs) as part of my medical decision making, as well as reviewing the written report by the radiologist.  DG Forearm Left  Result Date: 05/06/2020 CLINICAL DATA:  Pain following fall EXAM: LEFT FOREARM - 2 VIEW COMPARISON:  None. FINDINGS: Frontal and lateral views were obtained. No fracture or dislocation. Joint spaces appear normal. No erosive change. IMPRESSION: No fracture or dislocation.  No evident arthropathy. Electronically Signed   By: 05/08/2020 III M.D.   On: 05/06/2020 09:38   DG Wrist Complete Left  Result Date: 05/06/2020 CLINICAL DATA:  Pain following fall EXAM: LEFT WRIST - COMPLETE 3+ VIEW COMPARISON:  None. FINDINGS: Frontal, oblique, lateral, and ulnar deviation scaphoid images were obtained. No fracture or  dislocation. Joint spaces appear normal. No erosive change. IMPRESSION: No fracture or dislocation.  No evident arthropathy. Electronically Signed   By: 05/08/2020 III M.D.   On: 05/06/2020 08:07   DG Finger Thumb Left  Result Date: 05/06/2020 CLINICAL DATA:  Pain following fall EXAM: LEFT THUMB 2+V COMPARISON:  None. FINDINGS: Frontal, oblique, and lateral views were obtained. No fracture or dislocation. Joint spaces appear normal. No erosive change. IMPRESSION: No fracture or dislocation.  No evident arthropathy. Electronically Signed   By: 05/08/2020 III M.D.   On: 05/06/2020 09:37    ____________________________________________    PROCEDURES  Procedure(s) performed:    Procedures    Medications  Tdap (BOOSTRIX) injection 0.5 mL (0.5 mLs Intramuscular Given 05/06/20 0813)  oxyCODONE-acetaminophen (PERCOCET/ROXICET) 5-325 MG per tablet 1 tablet (1 tablet Oral Given 05/06/20 0915)     ____________________________________________   INITIAL IMPRESSION / ASSESSMENT AND PLAN / ED COURSE  Pertinent labs & imaging results that were available during my care of the patient were reviewed by me and considered in my medical decision making (see  chart for details).  Review of the [place] CSRS was performed in accordance of the [facility] prior to dispensing any controlled drugs.   Patient's diagnosis is consistent with wrist sprain.  Vital signs and exam are reassuring.  X-ray allergies.  Wrist splint was placed.  Patient will be discharged home with prescriptions for Motrin. Patient is to follow up with primary care as directed. Patient is given ED precautions to return to the ED for any worsening or new symptoms.  Baker Kogler was evaluated in Emergency Department on 05/06/2020 for the symptoms described in the history of present illness. She was evaluated in the context of the global COVID-19 pandemic, which necessitated consideration that the patient might be at risk  for infection with the SARS-CoV-2 virus that causes COVID-19. Institutional protocols and algorithms that pertain to the evaluation of patients at risk for COVID-19 are in a state of rapid change based on information released by regulatory bodies including the CDC and federal and state organizations. These policies and algorithms were followed during the patient's care in the ED.   ____________________________________________  FINAL CLINICAL IMPRESSION(S) / ED DIAGNOSES  Final diagnoses:  Sprain of left wrist, initial encounter      NEW MEDICATIONS STARTED DURING THIS VISIT:  ED Discharge Orders         Ordered    ibuprofen (ADVIL) 600 MG tablet  Every 6 hours PRN        05/06/20 0958              This chart was dictated using voice recognition software/Dragon. Despite best efforts to proofread, errors can occur which can change the meaning. Any change was purely unintentional.    Enid Derry, PA-C 05/06/20 1353    Dionne Bucy, MD 05/06/20 1431

## 2020-05-09 ENCOUNTER — Other Ambulatory Visit: Payer: Self-pay

## 2020-05-09 ENCOUNTER — Ambulatory Visit (INDEPENDENT_AMBULATORY_CARE_PROVIDER_SITE_OTHER): Payer: Medicaid Other | Admitting: Dermatology

## 2020-05-09 DIAGNOSIS — L700 Acne vulgaris: Secondary | ICD-10-CM | POA: Diagnosis not present

## 2020-05-09 DIAGNOSIS — L810 Postinflammatory hyperpigmentation: Secondary | ICD-10-CM

## 2020-05-09 MED ORDER — DOXYCYCLINE HYCLATE 20 MG PO TABS: 20.0000 mg | ORAL_TABLET | Freq: Two times a day (BID) | ORAL | 0 refills | Status: AC

## 2020-05-09 MED ORDER — TRETINOIN 0.025 % EX CREA
TOPICAL_CREAM | Freq: Every day | CUTANEOUS | 0 refills | Status: DC
Start: 2020-05-09 — End: 2020-07-02

## 2020-05-09 MED ORDER — CLINDAMYCIN PHOSPHATE 1 % EX SOLN: Freq: Two times a day (BID) | CUTANEOUS | 0 refills | Status: DC

## 2020-05-09 NOTE — Progress Notes (Signed)
   New Patient Visit  Subjective  Krista Farley is a 29 y.o. female who presents for the following: Acne   Acne is mainly on face. Pt developed acne a long time ago after starting alprazalam and sertraline, and the acne never went away. Currently still taking sertraline.  She gets many inflamed and dark areas of the face primarily.  Objective  Well appearing patient in no apparent distress; mood and affect are within normal limits.  A focused examination was performed including face, neck, chest, back. Relevant physical exam findings are noted in the Assessment and Plan.  Objective  face, chest, and back: 1+ open and closed comedones, scattered inflammatory papules on cheeks. Back with few inflammatory papules. Chest with few small inflammatory papules.   Objective  face: Hyperpigmented macules  Assessment & Plan  Acne vulgaris face, chest, and back  Chronic, currently flared.  Start Doxycycline 20 mg BID, take with food.  Doxycycline should be taken with food to prevent nausea. Do not lay down for 30 minutes after taking. Be cautious with sun exposure and use good sun protection while on this medication. Pregnant women should not take this medication.   Start Retin-A 0.025% cream. Apply to entire face before work (at night).   Start clindamycin 1% solution. Apply to entire face after work (in morning)  Recommend [employer] hypochlorous spray. Apply to face once a day.     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 This cannot be used more than 3 months due to risk of exogenous ochronosis (permanent dark spots). The  patient was advised this is not covered by insurance. They will receive an email to check out and the medication will be mailed to their home.   Start cream from [employer]. Apply pea-sized amount to entire face twice a day. Use only for 3 months.   Return in about 6 weeks (around 06/20/2020) for recheck acne.   I, Epifania Gore, CMA, am acting as scribe for Darden Dates, MD.  Documentation: I have reviewed the above documentation for accuracy and completeness, and I agree with the above.  Darden Dates, MD

## 2020-05-09 NOTE — Patient Instructions (Addendum)
Recommend Panoxyl creamy wash.    Sunscreen/bloqueador del sol - Alastin (con tinta) - Cerave - Neutrogena - La Roche Posay

## 2020-05-12 ENCOUNTER — Encounter: Payer: Self-pay | Admitting: Dermatology

## 2020-07-02 ENCOUNTER — Other Ambulatory Visit: Payer: Self-pay

## 2020-07-02 ENCOUNTER — Ambulatory Visit (INDEPENDENT_AMBULATORY_CARE_PROVIDER_SITE_OTHER): Payer: Medicaid Other | Admitting: Dermatology

## 2020-07-02 DIAGNOSIS — L700 Acne vulgaris: Secondary | ICD-10-CM

## 2020-07-02 DIAGNOSIS — L219 Seborrheic dermatitis, unspecified: Secondary | ICD-10-CM | POA: Diagnosis not present

## 2020-07-02 DIAGNOSIS — L810 Postinflammatory hyperpigmentation: Secondary | ICD-10-CM | POA: Diagnosis not present

## 2020-07-02 MED ORDER — KETOCONAZOLE 2 % EX SHAM: MEDICATED_SHAMPOO | CUTANEOUS | 0 refills | Status: DC

## 2020-07-02 MED ORDER — DOXYCYCLINE MONOHYDRATE 100 MG PO CAPS: ORAL_CAPSULE | ORAL | 2 refills | Status: DC

## 2020-07-02 MED ORDER — FLUOCINOLONE ACETONIDE SCALP 0.01 % EX OIL: TOPICAL_OIL | CUTANEOUS | 2 refills | Status: DC

## 2020-07-02 MED ORDER — TRETINOIN 0.05 % EX CREA: TOPICAL_CREAM | Freq: Every evening | CUTANEOUS | 2 refills | Status: DC

## 2020-07-02 NOTE — Progress Notes (Signed)
Follow-Up Visit   Subjective  Krista Farley is a 30 y.o. female who presents for the following: Acne (6 weeks f/u on acne on face, pt taking Doxycycline 20 mg qd, Retin A 0.025% cream, Clindamycin sol with a poor response, ). She also notes a long history >1 year of scale and itch at the scalp which is bothersome.   Interpreter with pt   The following portions of the chart were reviewed this encounter and updated as appropriate:   Tobacco  Allergies  Meds  Problems  Med Hx  Surg Hx  Fam Hx      Review of Systems:  No other skin or systemic complaints except as noted in HPI or Assessment and Plan.  Objective  Well appearing patient in no apparent distress; mood and affect are within normal limits.  A focused examination was performed including face, scalp, chest and back. Relevant physical exam findings are noted in the Assessment and Plan.  Objective  Head - Anterior (Face): Trace open comedones at face, scattered inflammatory papules at face Few inflammatory papules at chest and back  Objective  Scalp: Pink patches with greasy scale.   Objective  Head - Anterior (Face): Hyperpigmented macules   Assessment & Plan  Acne vulgaris Head - Anterior (Face)  Chronic condition with duration over one year. Condition is bothersome to patient. Not currently at goal.    D/c Doxycyline 20 mg D/c Retin A 0.025% cream   Increase to Doxycyline 100 mg take 1 tablet twice a day with food   Increase to Retin A 0.05% cream apply a thin film to face qhs   Continue clindamycin solution qam  Doxycycline should be taken with food to prevent nausea. Do not lay down for 30 minutes after taking. Be cautious with sun exposure and use good sun protection while on this medication. Pregnant women should not take this medication.   Topical retinoid medications like tretinoin/Retin-A, adapalene/Differin, tazarotene/Fabior, and Epiduo/Epiduo Forte can cause dryness and irritation  when first started. Only apply a pea-sized amount to the entire affected area. Avoid applying it around the eyes, edges of mouth and creases at the nose. If you experience irritation, use a good moisturizer first and/or apply the medicine less often. If you are doing well with the medicine, you can increase how often you use it until you are applying every night. Be careful with sun protection while using this medication as it can make you sensitive to the sun. This medicine should not be used by pregnant women.    Ordered Medications: doxycycline (MONODOX) 100 MG capsule tretinoin (RETIN-A) 0.05 % cream  Other Related Medications clindamycin (CLEOCIN T) 1 % external solution  Seborrheic dermatitis Scalp  Chronic condition with duration over one year. Condition is bothersome to patient. Currently flared.  There is no cure, only control.  Start Fluocinolone solution twice a day as needed. Avoid applying to face, groin, and axilla. Use as directed. Risk of skin atrophy with long-term use reviewed.   Start Ketoconazole shampoo apply three times per week, massage into scalp and leave in for 10 minutes before rinsing out   Ordered Medications: ketoconazole (NIZORAL) 2 % shampoo Fluocinolone Acetonide Scalp 0.01 % OIL  Postinflammatory hyperpigmentation Head - Anterior (Face)  Continue [employer] Hydroquinone 12%/kojic acid/vitamin C cream pea sized amount twice daily mixed in moisturizer to the entire face for one more month.   This cannot be used more than 3 months due to risk of exogenous ochronosis (  permanent dark spots). The patient was advised this is not covered by insurance since it is made by a compounding pharmacy. They will receive an email to check out and the medication will be mailed to their home.    Return in about 6 weeks (around 08/13/2020).  I, Angelique Holm, CMA, am acting as scribe for Darden Dates, MD .  Documentation: I have reviewed the above  documentation for accuracy and completeness, and I agree with the above.  Darden Dates, MD

## 2020-07-03 ENCOUNTER — Encounter: Payer: Self-pay | Admitting: Dermatology

## 2020-08-27 ENCOUNTER — Ambulatory Visit: Payer: Medicaid Other | Admitting: Dermatology

## 2020-10-22 ENCOUNTER — Ambulatory Visit (INDEPENDENT_AMBULATORY_CARE_PROVIDER_SITE_OTHER): Payer: Medicaid Other | Admitting: Dermatology

## 2020-10-22 ENCOUNTER — Other Ambulatory Visit: Payer: Self-pay

## 2020-10-22 DIAGNOSIS — L700 Acne vulgaris: Secondary | ICD-10-CM

## 2020-10-22 DIAGNOSIS — L219 Seborrheic dermatitis, unspecified: Secondary | ICD-10-CM | POA: Diagnosis not present

## 2020-10-22 DIAGNOSIS — L309 Dermatitis, unspecified: Secondary | ICD-10-CM

## 2020-10-22 DIAGNOSIS — Z8659 Personal history of other mental and behavioral disorders: Secondary | ICD-10-CM

## 2020-10-22 MED ORDER — NORETHINDRONE ACET-ETHINYL EST 1-20 MG-MCG PO TABS: 1.0000 | ORAL_TABLET | Freq: Every day | ORAL | 9 refills | Status: DC

## 2020-10-22 MED ORDER — KETOCONAZOLE 2 % EX SHAM: MEDICATED_SHAMPOO | CUTANEOUS | 6 refills | Status: DC

## 2020-10-22 MED ORDER — AZITHROMYCIN 500 MG PO TABS
ORAL_TABLET | ORAL | 1 refills | Status: DC
Start: 2020-10-22 — End: 2020-11-27

## 2020-10-22 MED ORDER — TRIAMCINOLONE ACETONIDE 0.1 % EX CREA: TOPICAL_CREAM | CUTANEOUS | 3 refills | Status: DC

## 2020-10-22 NOTE — Progress Notes (Signed)
Follow-Up Visit   Subjective  Krista Farley is a 30 y.o. female who presents for the following: Acne (4 months f/u Acne on her face, past treatment Doxycyline 100 mg bid, Retin A .05% cream, Clindamycin solution not much help. ). Pt has a tubal ligation  History of depression 1 year ago-improved now   Interpreter with patient  The following portions of the chart were reviewed this encounter and updated as appropriate:   Tobacco  Allergies  Meds  Problems  Med Hx  Surg Hx  Fam Hx      Review of Systems:  No other skin or systemic complaints except as noted in HPI or Assessment and Plan.  Objective  Well appearing patient in no apparent distress; mood and affect are within normal limits.  A focused examination was performed including face,chest,back . Relevant physical exam findings are noted in the Assessment and Plan.  Objective  face: Many scattered inflammatory papules, 1+ open and closed comedones at face and back, rare resolving cyst at cheeks, rare scarring at cheeks   Objective  Scalp: Pink patches with greasy scale.   Objective  Neck: Scaly pink papules coalescing to plaques    Assessment & Plan  Acne vulgaris face  Chronic condition with duration over one year. Condition is bothersome to patient. Currently flared.  Reviewed potential side effects of isotretinoin including xerosis, cheilitis, hepatitis, hyperlipidemia, and severe birth defects if taken by a pregnant woman. Reviewed reports of suicidal ideation in those with a history of depression while taking isotretinoin and reports of diagnosis of inflammatory bowl disease while taking isotretinoin as well as the lack of evidence for a causal relationship between isotretinoin, depression and IBD. Patient advised to reach out with any questions or concerns. Patient advised not to share pills or donate blood while on treatment or for one month after completing treatment No history of blood  clots No issues taking birth control tablets in the past  No history of migraine headaches No history of smoking   Plan Isotretinoin 20 mg daily after 1 month waiting period  Urine pregnancy test performed in office today and was negative.  Patient demonstrates comprehension and confirms she will not get pregnant.    Start Azithromycin 500 mg take 1 tablet 3 days a week #12 Start Microgestin 1-20 mg-mcg take 1 tablet daily for pregnancy prevention  Registered in [place]    Ordered Medications: norethindrone-ethinyl estradiol (MICROGESTIN) 1-20 MG-MCG tablet azithromycin (ZITHROMAX) 500 MG tablet  Seborrheic dermatitis Scalp  Seborrheic Dermatitis  -  is a chronic persistent rash characterized by pinkness and scaling most commonly of the mid face but also can occur on the scalp (dandruff), ears; mid chest and mid back. It tends to be exacerbated by stress and cooler weather.  People who have neurologic disease may experience new onset or exacerbation of existing seborrheic dermatitis.  The condition is not curable but treatable and can be controlled.   Chronic condition with duration or expected duration over one year. Currently well-controlled.  Cont Ketoconazole shampoo at least 3 days a week  Reordered Medications ketoconazole (NIZORAL) 2 % shampoo  Dermatitis Neck  Start triamcinolone 0.1% cream apply twice daily as needed to affected areas for two weeks then apply twice daily on weekends only  Avoid applying to face, groin, and axilla. Use as directed. Risk of skin atrophy with long-term use reviewed.    Topical steroids (such as triamcinolone, fluocinolone, fluocinonide, mometasone, clobetasol, halobetasol, betamethasone, hydrocortisone) can cause  thinning and lightening of the skin if they are used for too long in the same area. Your physician has selected the right strength medicine for your problem and area affected on the body. Please use your medication only as  directed by your physician to prevent side effects.    Ordered Medications: triamcinolone cream (KENALOG) 0.1 %  Return in about 30 days (around 11/21/2020) for Acne/ Isotretinoin .  I, Angelique Holm, CMA, am acting as scribe for Darden Dates, MD .  Documentation: I have reviewed the above documentation for accuracy and completeness, and I agree with the above.  Darden Dates, MD

## 2020-10-22 NOTE — Patient Instructions (Addendum)
  Reviewed potential side effects of isotretinoin including xerosis, cheilitis, hepatitis, hyperlipidemia, and severe birth defects if taken by a pregnant woman. Reviewed reports of suicidal ideation in those with a history of depression while taking isotretinoin and reports of diagnosis of inflammatory bowl disease while taking isotretinoin as well as the lack of evidence for a causal relationship between isotretinoin, depression and IBD. Patient advised to reach out with any questions or concerns. Patient advised not to share pills or donate blood while on treatment or for one month after completing treatment.  Topical steroids (such as triamcinolone, fluocinolone, fluocinonide, mometasone, clobetasol, halobetasol, betamethasone, hydrocortisone) can cause thinning and lightening of the skin if they are used for too long in the same area. Your physician has selected the right strength medicine for your problem and area affected on the body. Please use your medication only as directed by your physician to prevent side effects.

## 2020-10-29 ENCOUNTER — Encounter: Payer: Self-pay | Admitting: Dermatology

## 2020-11-27 ENCOUNTER — Ambulatory Visit (INDEPENDENT_AMBULATORY_CARE_PROVIDER_SITE_OTHER): Payer: Medicaid Other | Admitting: Dermatology

## 2020-11-27 ENCOUNTER — Other Ambulatory Visit: Payer: Self-pay

## 2020-11-27 DIAGNOSIS — L309 Dermatitis, unspecified: Secondary | ICD-10-CM

## 2020-11-27 DIAGNOSIS — L700 Acne vulgaris: Secondary | ICD-10-CM | POA: Diagnosis not present

## 2020-11-27 MED ORDER — DERMA-SMOOTHE/FS SCALP 0.01 % EX OIL: TOPICAL_OIL | CUTANEOUS | 5 refills | Status: DC

## 2020-11-27 MED ORDER — TRIAMCINOLONE ACETONIDE 0.1 % EX CREA: TOPICAL_CREAM | CUTANEOUS | 3 refills | Status: DC

## 2020-11-27 NOTE — Patient Instructions (Signed)
Reviewed potential side effects of isotretinoin including xerosis, cheilitis, hepatitis, hyperlipidemia, and severe birth defects if taken by a pregnant woman. Reviewed reports of suicidal ideation in those with a history of depression while taking isotretinoin and reports of diagnosis of inflammatory bowl disease while taking isotretinoin as well as the lack of evidence for a causal relationship between isotretinoin, depression and IBD. Patient advised to reach out with any questions or concerns. Patient advised not to share pills or donate blood while on treatment or for one month after completing treatment.     If you have any questions or concerns for your doctor, please call our main line at [phone] and press option 4 to reach your doctor's medical assistant. If no one answers, please leave a voicemail as directed and we will return your call as soon as possible. Messages left after 4 pm will be answered the following business day.   You may also send us a message via MyChart. We typically respond to MyChart messages within 1-2 business days.  For prescription refills, please ask your pharmacy to contact our office. Our fax number is [phone].  If you have an urgent issue when the clinic is closed that cannot wait until the next business day, you can page your doctor at the number below.    Please note that while we do our best to be available for urgent issues outside of office hours, we are not available 24/7.   If you have an urgent issue and are unable to reach us, you may choose to seek medical care at your doctor's office, retail clinic, urgent care center, or emergency room.  If you have a medical emergency, please immediately call 911 or go to the emergency department.  Pager Numbers  - Dr. Kowalski: [phone]  - Dr. Moye: [phone]  - Dr. Stewart: [phone]  In the event of inclement weather, please call our main line at [phone] for an update on the  status of any delays or closures.  Dermatology Medication Tips: Please keep the boxes that topical medications come in in order to help keep track of the instructions about where and how to use these. Pharmacies typically print the medication instructions only on the boxes and not directly on the medication tubes.   If your medication is too expensive, please contact our office at [phone] option 4 or send us a message through MyChart.   We are unable to tell what your co-pay for medications will be in advance as this is different depending on your insurance coverage. However, we may be able to find a substitute medication at lower cost or fill out paperwork to get insurance to cover a needed medication.   If a prior authorization is required to get your medication covered by your insurance company, please allow us 1-2 business days to complete this process.  Drug prices often vary depending on where the prescription is filled and some pharmacies may offer cheaper prices.  The website www.goodrx.com contains coupons for medications through different pharmacies. The prices here do not account for what the cost may be with help from insurance (it may be cheaper with your insurance), but the website can give you the price if you did not use any insurance.  - You can print the associated coupon and take it with your prescription to the pharmacy.  - You may also stop by our office during regular business hours and pick up a GoodRx coupon card.  - If you need your prescription   sent electronically to a different pharmacy, notify our office through [facility] MyChart or by phone at [phone] option 4.  

## 2020-11-27 NOTE — Progress Notes (Signed)
   Follow-Up Visit   Subjective  Krista Farley is a 30 y.o. female who presents for the following: Acne (Patient here to start isotretinoin for acne of the face. She is not on any medicines for acne other than oral BCPs.).   The following portions of the chart were reviewed this encounter and updated as appropriate:        Review of Systems:  No other skin or systemic complaints except as noted in HPI or Assessment and Plan.  Objective  Well appearing patient in no apparent distress; mood and affect are within normal limits.  A focused examination was performed including face. Relevant physical exam findings are noted in the Assessment and Plan.  face Cysts on the left cheek; hyperpigmented macules and some scarring of the bilateral cheeks.   Assessment & Plan  Acne vulgaris face  Cystic acne with scarring Recommend isotretinoin  Reviewed potential side effects of isotretinoin including xerosis, cheilitis, hepatitis, hyperlipidemia, and severe birth defects if taken by a pregnant woman. Reviewed reports of suicidal ideation in those with a history of depression while taking isotretinoin and reports of diagnosis of inflammatory bowl disease while taking isotretinoin as well as the lack of evidence for a causal relationship between isotretinoin, depression and IBD. Patient advised to reach out with any questions or concerns. Patient advised not to share pills or donate blood while on treatment or for one month after completing treatment.  Patient has a history of depression in the past, but has cleared now.  Pending labs, start Absorica LD 24mg  take 1 po QD dsp #30  [facility] iPLEDGE # [name] 2 forms of BC: Tubal ligation Oral Contraceptive   Related Procedures Comprehensive metabolic panel Lipid panel hCG, serum, qualitative  Related Medications norethindrone-ethinyl estradiol (MICROGESTIN) 1-20 MG-MCG tablet Take 1 tablet by mouth  daily.  Return in about 1 month (around 12/27/2020) for Acne/isotretinoin f/u.  I9/01/2021, CMA, am acting as scribe for Cherlyn Labella, MD .  Documentation: I have reviewed the above documentation for accuracy and completeness, and I agree with the above.  Willeen Niece MD

## 2020-11-28 LAB — COMPREHENSIVE METABOLIC PANEL
ALT: 14 IU/L (ref 0–32)
AST: 15 IU/L (ref 0–40)
Albumin/Globulin Ratio: 1.5 (ref 1.2–2.2)
Albumin: 4.3 g/dL (ref 3.8–4.8)
Alkaline Phosphatase: 88 IU/L (ref 44–121)
BUN/Creatinine Ratio: 22 (ref 9–23)
BUN: 14 mg/dL (ref 6–20)
Bilirubin Total: <0.2 mg/dL (ref 0.0–1.2)
CO2: 19 mmol/L — ABNORMAL LOW (ref 20–29)
Calcium: 9.1 mg/dL (ref 8.7–10.2)
Chloride: 103 mmol/L (ref 96–106)
Creatinine, Ser: 0.65 mg/dL (ref 0.57–1.00)
Globulin, Total: 2.8 g/dL (ref 1.5–4.5)
Glucose: 90 mg/dL (ref 65–99)
Potassium: 3.9 mmol/L (ref 3.5–5.2)
Sodium: 143 mmol/L (ref 134–144)
Total Protein: 7.1 g/dL (ref 6.0–8.5)
eGFR: 121 mL/min/{1.73_m2} (ref >59)

## 2020-11-28 LAB — LIPID PANEL
Chol/HDL Ratio: 5.9 ratio — ABNORMAL HIGH (ref 0.0–4.4)
Cholesterol, Total: 188 mg/dL (ref 100–199)
HDL: 32 mg/dL — ABNORMAL LOW (ref >39)
LDL Chol Calc (NIH): 89 mg/dL (ref 0–99)
Triglycerides: 407 mg/dL — ABNORMAL HIGH (ref 0–149)
VLDL Cholesterol Cal: 67 mg/dL — ABNORMAL HIGH (ref 5–40)

## 2020-11-28 LAB — HCG, SERUM, QUALITATIVE: hCG,Beta Subunit,Qual,Serum: NEGATIVE m[IU]/mL (ref <6)

## 2020-11-29 ENCOUNTER — Telehealth: Payer: Self-pay

## 2020-11-29 DIAGNOSIS — L700 Acne vulgaris: Secondary | ICD-10-CM

## 2020-11-29 NOTE — Telephone Encounter (Signed)
Advised pt of labs and advised Triglycerides were very high.  Patient did say she did the labwork fasting.  I advised we needed to repeat the lipid panel and to do it first thing in the morning before she eats breakfast.  Explained she needed to be fasting for at least 8 hours.  She will come by the office on Monday morning and pick up the lab order and go to [facility].  I did add the hcg test since the previous lab was on 11/27/20 and she would be close if not out of her 7 day window when we would be confirming pt in IPLEDGE./sh

## 2020-11-29 NOTE — Telephone Encounter (Signed)
-----   Message from Willeen Niece, MD sent at 11/28/2020  8:05 PM EDT ----- Pregnancy negative, Tgs at baseline are very high at 407.  I would like her to repeat the lipid panel when she is fasting for at least 8 hours (can go first thing in morning before breakfast).  If they are still high, she will need to get appointment with PCP for medication to help lower them before we start isotretinoin, because the Isotretinoin will further elevate the TGs

## 2020-12-03 ENCOUNTER — Telehealth: Payer: Self-pay

## 2020-12-03 LAB — LIPID PANEL
Chol/HDL Ratio: 5.1 ratio — ABNORMAL HIGH (ref 0.0–4.4)
Cholesterol, Total: 195 mg/dL (ref 100–199)
HDL: 38 mg/dL — ABNORMAL LOW (ref >39)
LDL Chol Calc (NIH): 126 mg/dL — ABNORMAL HIGH (ref 0–99)
Triglycerides: 174 mg/dL — ABNORMAL HIGH (ref 0–149)
VLDL Cholesterol Cal: 31 mg/dL (ref 5–40)

## 2020-12-03 LAB — HCG, SERUM, QUALITATIVE: hCG,Beta Subunit,Qual,Serum: NEGATIVE m[IU]/mL (ref <6)

## 2020-12-03 MED ORDER — ABSORICA LD 16 MG PO CAPS: 1.0000 | ORAL_CAPSULE | Freq: Every day | ORAL | 0 refills | Status: DC

## 2020-12-03 NOTE — Telephone Encounter (Signed)
-----   Message from Tara Stewart, MD sent at 12/03/2020  9:10 AM EDT ----- Preg negative, baseline Tgs much lower (174) than previous check (407) okay to start Absorica LD 16mg.  This is a lower dose than stated in note.  Will likely check her blood more frequently on medication to follow the Tg level, and every time it is done it will need to be fasting. 

## 2020-12-03 NOTE — Telephone Encounter (Signed)
Left pt message to call for lab results.  Confirmed pt in [facility] program, sent Absorica LD 16mg  1 po qd #30 0rf to [id number]

## 2020-12-04 ENCOUNTER — Telehealth: Payer: Self-pay

## 2020-12-04 NOTE — Telephone Encounter (Signed)
-----   Message from Willeen Niece, MD sent at 12/03/2020  9:10 AM EDT ----- Preg negative, baseline Tgs much lower (174) than previous check (407) okay to start Absorica LD 16mg .  This is a lower dose than stated in note.  Will likely check her blood more frequently on medication to follow the Tg level, and every time it is done it will need to be fasting.

## 2020-12-04 NOTE — Telephone Encounter (Signed)
Left pt msg to call about labs.  Pt has been confirmed in IPLEDGE program and Absorica LD 16mg  has been sent to 

## 2020-12-05 ENCOUNTER — Telehealth: Payer: Self-pay

## 2020-12-05 NOTE — Telephone Encounter (Signed)
Discussed with pt's husband Elita Quick please have pt contact [facility] today, so pt can get her Absorica RX before her 7 day window expire,   8:15 am

## 2020-12-24 ENCOUNTER — Other Ambulatory Visit: Payer: Self-pay

## 2020-12-24 DIAGNOSIS — L309 Dermatitis, unspecified: Secondary | ICD-10-CM

## 2020-12-24 DIAGNOSIS — L700 Acne vulgaris: Secondary | ICD-10-CM

## 2020-12-24 MED ORDER — ABSORICA LD 16 MG PO CAPS: 1.0000 | ORAL_CAPSULE | Freq: Every day | ORAL | 0 refills | Status: DC

## 2020-12-24 MED ORDER — TRIAMCINOLONE ACETONIDE 0.1 % EX CREA: TOPICAL_CREAM | CUTANEOUS | 3 refills | Status: DC

## 2020-12-24 MED ORDER — NORETHINDRONE ACET-ETHINYL EST 1-20 MG-MCG PO TABS: 1.0000 | ORAL_TABLET | Freq: Every day | ORAL | 9 refills | Status: DC

## 2020-12-24 NOTE — Progress Notes (Signed)
[facility] states [facility] states they do not have birth control nor the Triamcinolone Cream. Fresh prescriptions sent in.

## 2020-12-24 NOTE — Progress Notes (Signed)
Patient came in today from being locked out in ipledge due to missing first window.  Urine pregnancy test negative.   Patient confirmed in the ipledge program and RX sent in. I walked patient through answering questions on her side.  Patient rescheduled for 30 days from today.

## 2021-01-01 ENCOUNTER — Ambulatory Visit: Payer: Medicaid Other | Admitting: Dermatology

## 2021-01-30 ENCOUNTER — Ambulatory Visit (INDEPENDENT_AMBULATORY_CARE_PROVIDER_SITE_OTHER): Payer: Medicaid Other | Admitting: Dermatology

## 2021-01-30 ENCOUNTER — Other Ambulatory Visit: Payer: Self-pay

## 2021-01-30 DIAGNOSIS — L853 Xerosis cutis: Secondary | ICD-10-CM

## 2021-01-30 DIAGNOSIS — L700 Acne vulgaris: Secondary | ICD-10-CM | POA: Diagnosis not present

## 2021-01-30 DIAGNOSIS — K130 Diseases of lips: Secondary | ICD-10-CM

## 2021-01-30 DIAGNOSIS — Z79899 Other long term (current) drug therapy: Secondary | ICD-10-CM | POA: Diagnosis not present

## 2021-01-30 MED ORDER — ISOTRETINOIN 40 MG PO CAPS
40.0000 mg | ORAL_CAPSULE | Freq: Every day | ORAL | 0 refills | Status: DC
Start: 2021-01-30 — End: 2021-03-18

## 2021-01-30 NOTE — Progress Notes (Signed)
   Isotretinoin Follow-Up Visit   Subjective  Krista Farley is a 30 y.o. female who presents for the following: Acne (Week 4 of Absorica LD 16mg  po QD - patient c/o dry lips and skin as well as joint aches).  Week # 4  Side effects: Dry skin, dry lips  Denies changes in night vision, shortness of breath, abdominal pain, nausea, vomiting, diarrhea, blood in stool or urine, visual changes, headaches, epistaxis, joint pain, myalgias, mood changes, depression, or suicidal ideation.   Patient is not pregnant, not seeking pregnancy, and not breastfeeding.  The following portions of the chart were reviewed this encounter and updated as appropriate: medications, allergies, medical history  Review of Systems:  No other skin or systemic complaints except as noted in HPI or Assessment and Plan.  Objective  Well appearing patient in no apparent distress; mood and affect are within normal limits.  An examination of the face, neck, chest, and back was performed and relevant findings are noted below.    Assessment & Plan   Acne vulgaris Face, chest, back  Severe and Chronic (present >1 year); currently on Isotretinoin and not to goal (must reach target dose based on weight and also have clear skin for 2 months prior to discontinuation in order to help prevent relapse)   Two methods of birth control include tubal ligation and Loestrin.   In office pregnancy test negative. Lot number: Expiration date: 06/21/2022  Will switch to Absorica 40 mg po QD due to muscle aches to see if she notes any difference. Advised to call 06/23/2022 if muscle aches worsen and become problematic.   ISOtretinoin (ABSORICA) 40 MG capsule - Face, chest, back Take 1 capsule (40 mg total) by mouth daily. Ipledge [place]  Related Medications norethindrone-ethinyl estradiol (MICROGESTIN) 1-20 MG-MCG tablet Take 1 tablet by mouth daily.  Xerosis secondary to isotretinoin therapy - Continue  emollients as directed - Recommend Dove for sensitive skin and moisturizer to damp skin after showering to lock in moisture.   Cheilitis secondary to isotretinoin therapy - Continue lip balm as directed, Dr. #[phone] Cortibalm recommended and Aquaphor.  Long term medication management (isotretinoin) - While taking Isotretinoin and for 30 days after you finish the medication, do not get pregnant, do not share pills, do not donate blood. Isotretinoin is best absorbed when taken with a fatty meal. Isotretinoin can make you sensitive to the sun. Daily careful sun protection including sunscreen SPF 30+ when outdoors is recommended.  Follow-up in 30 days.  Clayborne Artist, CMA, am acting as scribe for Maylene Roes, MD .  Documentation: I have reviewed the above documentation for accuracy and completeness, and I agree with the above.  Darden Dates, MD

## 2021-01-30 NOTE — Patient Instructions (Addendum)
If you have any questions or concerns for your doctor, please call our main line at [phone] and press option 4 to reach your doctor's medical assistant. If no one answers, please leave a voicemail as directed and we will return your call as soon as possible. Messages left after 4 pm will be answered the following business day.   You may also send [place] a message via MyChart. We typically respond to MyChart messages within 1-2 business days.  For prescription refills, please ask your pharmacy to contact our office. Our fax number is [phone].  If you have an urgent issue when the clinic is closed that cannot wait until the next business day, you can page your doctor at the number below.    Please note that while we do our best to be available for urgent issues outside of office hours, we are not available 24/7.   If you have an urgent issue and are unable to reach [place], you may choose to seek medical care at your doctor's office, retail clinic, urgent care center, or emergency room.  If you have a medical emergency, please immediately call 911 or go to the emergency department.  Pager Numbers  - Dr. Gwen Pounds: [phone]  - Dr. Neale Burly: [phone]  - Dr. Roseanne Reno: [phone]  In the event of inclement weather, please call our main line at [phone] for an update on the status of any delays or closures.  Dermatology Medication Tips: Please keep the boxes that topical medications come in in order to help keep track of the instructions about where and how to use these. Pharmacies typically print the medication instructions only on the boxes and not directly on the medication tubes.   If your medication is too expensive, please contact our office at [phone] option 4 or send [place] a message through MyChart.   We are unable to tell what your co-pay for medications will be in advance as this is different depending on your insurance coverage. However, we may be able to find a substitute  medication at lower cost or fill out paperwork to get insurance to cover a needed medication.   If a prior authorization is required to get your medication covered by your insurance company, please allow [place] 1-2 business days to complete this process.  Drug prices often vary depending on where the prescription is filled and some pharmacies may offer cheaper prices.  The website www.goodrx.com contains coupons for medications through different pharmacies. The prices here do not account for what the cost may be with help from insurance (it may be cheaper with your insurance), but the website can give you the price if you did not use any insurance.  - You can print the associated coupon and take it with your prescription to the pharmacy.  - You may also stop by our office during regular business hours and pick up a GoodRx coupon card.  - If you need your prescription sent electronically to a different pharmacy, notify our office through [facility] or by phone at [phone] option 4.  Recommend Dr. Clayborne Artist lip balm and/or Aquaphor ointment.

## 2021-02-08 ENCOUNTER — Encounter: Payer: Self-pay | Admitting: Dermatology

## 2021-03-12 ENCOUNTER — Ambulatory Visit: Payer: Medicaid Other | Admitting: Dermatology

## 2021-03-18 ENCOUNTER — Other Ambulatory Visit: Payer: Self-pay

## 2021-03-18 ENCOUNTER — Ambulatory Visit (INDEPENDENT_AMBULATORY_CARE_PROVIDER_SITE_OTHER): Payer: Medicaid Other | Admitting: Dermatology

## 2021-03-18 ENCOUNTER — Encounter: Payer: Self-pay | Admitting: Dermatology

## 2021-03-18 VITALS — Wt 170.0 lb

## 2021-03-18 DIAGNOSIS — Z79899 Other long term (current) drug therapy: Secondary | ICD-10-CM

## 2021-03-18 DIAGNOSIS — L853 Xerosis cutis: Secondary | ICD-10-CM | POA: Diagnosis not present

## 2021-03-18 DIAGNOSIS — K130 Diseases of lips: Secondary | ICD-10-CM | POA: Diagnosis not present

## 2021-03-18 DIAGNOSIS — L700 Acne vulgaris: Secondary | ICD-10-CM | POA: Diagnosis not present

## 2021-03-18 MED ORDER — NORETHINDRONE ACET-ETHINYL EST 1-20 MG-MCG PO TABS: 1.0000 | ORAL_TABLET | Freq: Every day | ORAL | 9 refills | Status: DC

## 2021-03-18 MED ORDER — ISOTRETINOIN 30 MG PO CAPS: 30.0000 mg | ORAL_CAPSULE | Freq: Two times a day (BID) | ORAL | 0 refills | Status: DC

## 2021-03-18 MED ORDER — ISOTRETINOIN 40 MG PO CAPS: 40.0000 mg | ORAL_CAPSULE | Freq: Every day | ORAL | 0 refills | Status: DC

## 2021-03-18 NOTE — Progress Notes (Signed)
Isotretinoin Follow-Up Visit   Subjective  Krista Farley is a 30 y.o. female who presents for the following: Acne (Isotretinoin therapy ). Pt taking Absorica 40 mg once a day, pt has only taken this prescription for 1 week as she was in [place] 5 weeks before that. She is tolerating it well.  Week # 5   Isotretinoin F/U - 03/18/21 1200       Isotretinoin Follow Up   iPledge # [id number]    Date 03/18/21    Weight 170 lb (77.1 kg)    Two Forms of Birth Control Tubal Sterilization;Abstinence    Acne breakouts since last visit? Yes      Dosage   Target Dosage (mg) 11,564      Side Effects   Skin Dry Skin    Gastrointestinal WNL    Neurological WNL    Constitutional WNL;Muscle/joint aches              Side effects: Dry skin, dry lips  Denies changes in night vision, shortness of breath, abdominal pain, nausea, vomiting, diarrhea, blood in stool or urine, visual changes, headaches, epistaxis, joint pain, myalgias, mood changes, depression, or suicidal ideation.   Patient is not pregnant, not seeking pregnancy, and not breastfeeding.   The following portions of the chart were reviewed this encounter and updated as appropriate: medications, allergies, medical history  Review of Systems:  No other skin or systemic complaints except as noted in HPI or Assessment and Plan.  Objective  Well appearing patient in no apparent distress; mood and affect are within normal limits.  An examination of the face, neck, chest, and back was performed and relevant findings are noted below.   face,chest,back Trace open comedones on the face, chest and back Face with many resolving papules, small inflammatory papule and rare cyst    Assessment & Plan   Acne vulgaris face,chest,back  On Isotretinoin -  requiring FDA mandated monthly evaluations and laboratory monitoring; Chronic and Persistent  Severe and Chronic (present >1 year); currently on Isotretinoin and  not to goal (must reach target dose based on weight and also have clear skin for 2 months prior to discontinuation in order to help prevent relapse)   Week 5  Urine pregnancy test performed in office today and was negative.  Patient demonstrates comprehension and confirms she will not get pregnant.    Plan on labs at next follow-up  [facility] iPLEDGE # [id number] 2 forms of BC: Tubal ligation Oral Contraceptive   Cont Absorica 40 mg take 1 tablet daily; increase to absorica 60 mg daily when she finishes the 40 mg capsules. Cont BC tablets daily   Related Medications norethindrone-ethinyl estradiol (MICROGESTIN) 1-20 MG-MCG tablet Take 1 tablet by mouth daily.  Xerosis secondary to isotretinoin therapy - Continue emollients as directed  Cheilitis secondary to isotretinoin therapy - Continue lip balm as directed, Dr. Clayborne Artist Cortibalm recommended  Long term medication management (isotretinoin) - While taking Isotretinoin and for 30 days after you finish the medication, do not get pregnant, do not share pills, do not donate blood. Isotretinoin is best absorbed when taken with a fatty meal. Isotretinoin can make you sensitive to the sun. Daily careful sun protection including sunscreen SPF 30+ when outdoors is recommended.  Follow-up in 6 weeks   I, Angelique Holm, CMA, am acting as scribe for Darden Dates, MD .   Documentation: I have reviewed the above documentation for accuracy and completeness, and I  agree with the above.  Forest Gleason, MD

## 2021-03-18 NOTE — Patient Instructions (Signed)

## 2021-04-30 ENCOUNTER — Ambulatory Visit: Payer: Medicaid Other | Admitting: Dermatology

## 2021-06-04 ENCOUNTER — Encounter: Payer: Self-pay | Admitting: Dermatology

## 2021-06-04 ENCOUNTER — Ambulatory Visit (INDEPENDENT_AMBULATORY_CARE_PROVIDER_SITE_OTHER): Payer: Medicaid Other | Admitting: Dermatology

## 2021-06-04 ENCOUNTER — Other Ambulatory Visit: Payer: Self-pay

## 2021-06-04 DIAGNOSIS — L700 Acne vulgaris: Secondary | ICD-10-CM

## 2021-06-04 MED ORDER — NORETHINDRONE ACET-ETHINYL EST 1-20 MG-MCG PO TABS: 1.0000 | ORAL_TABLET | Freq: Every day | ORAL | 9 refills | Status: DC

## 2021-06-04 MED ORDER — DOXYCYCLINE HYCLATE 20 MG PO TABS: 20.0000 mg | ORAL_TABLET | Freq: Two times a day (BID) | ORAL | 0 refills | Status: AC

## 2021-06-04 NOTE — Patient Instructions (Signed)
Doxycycline should be taken with food to prevent nausea. Do not lay down for 30 minutes after taking. Be cautious with sun exposure and use good sun protection while on this medication. Pregnant women should not take this medication.   While taking Isotretinoin and for 30 days after you finish the medication, do not get pregnant, do not share pills, do not donate blood.  Generic isotretinoin is best absorbed when taken with a fatty meal. Isotretinoin can make you sensitive to the sun. Daily careful sun protection including sunscreen SPF 30+ when outdoors is recommended.   If You Need Anything After Your Visit  If you have any questions or concerns for your doctor, please call our main line at [phone] and press option 4 to reach your doctor's medical assistant. If no one answers, please leave a voicemail as directed and we will return your call as soon as possible. Messages left after 4 pm will be answered the following business day.   You may also send [place] a message via MyChart. We typically respond to MyChart messages within 1-2 business days.  For prescription refills, please ask your pharmacy to contact our office. Our fax number is [phone].  If you have an urgent issue when the clinic is closed that cannot wait until the next business day, you can page your doctor at the number below.    Please note that while we do our best to be available for urgent issues outside of office hours, we are not available 24/7.   If you have an urgent issue and are unable to reach [place], you may choose to seek medical care at your doctor's office, retail clinic, urgent care center, or emergency room.  If you have a medical emergency, please immediately call 911 or go to the emergency department.  Pager Numbers  - Dr. Gwen Pounds: [phone]  - Dr. Neale Burly: [phone]  - Dr. Roseanne Reno: [phone]  In the event of inclement weather, please call our main line at [phone] for an update on the  status of any delays or closures.  Dermatology Medication Tips: Please keep the boxes that topical medications come in in order to help keep track of the instructions about where and how to use these. Pharmacies typically print the medication instructions only on the boxes and not directly on the medication tubes.   If your medication is too expensive, please contact our office at [phone] option 4 or send [place] a message through MyChart.   We are unable to tell what your co-pay for medications will be in advance as this is different depending on your insurance coverage. However, we may be able to find a substitute medication at lower cost or fill out paperwork to get insurance to cover a needed medication.   If a prior authorization is required to get your medication covered by your insurance company, please allow [place] 1-2 business days to complete this process.  Drug prices often vary depending on where the prescription is filled and some pharmacies may offer cheaper prices.  The website www.goodrx.com contains coupons for medications through different pharmacies. The prices here do not account for what the cost may be with help from insurance (it may be cheaper with your insurance), but the website can give you the price if you did not use any insurance.  - You can print the associated coupon and take it with your prescription to the pharmacy.  - You may also stop by our office during regular business hours and pick up  a GoodRx coupon card.  - If you need your prescription sent electronically to a different pharmacy, notify our office through [facility] or by phone at [phone] option 4.     Si Usted Necesita Algo Despus de Su Visita  Tambin puede enviarnos un mensaje a travs de Clinical cytogeneticist. Por lo general respondemos a los mensajes de MyChart en el transcurso de 1 a 2 das hbiles.  Para renovar recetas, por favor pida a su farmacia que se ponga en contacto con nuestra oficina.  Annie Sable de fax es [place] [phone].  Si tiene un asunto urgente cuando la clnica est cerrada y que no puede esperar hasta el siguiente da hbil, puede llamar/localizar a su doctor(a) al nmero que aparece a continuacin.   Por favor, tenga en cuenta que aunque hacemos todo lo posible para estar disponibles para asuntos urgentes fuera del horario de [place], no estamos disponibles las 24 horas del da, los 7 [address]  [address] de la [place].   Si tiene un problema urgente y no puede comunicarse con nosotros, puede optar por buscar atencin mdica  en el consultorio de su doctor(a), en una clnica privada, en un centro de atencin urgente o en una sala de emergencias.  Si tiene Engineer, drilling, por favor llame inmediatamente al 911 o vaya a la sala de emergencias.  Nmeros de bper  - Dr. Gwen Pounds: [phone]  - Dra. Moye: [phone]  - Dra. Roseanne Reno: [phone]  En caso de inclemencias del [place], por favor llame a Lacy Duverney principal al [phone] para una actualizacin sobre el [place] de cualquier retraso o cierre.  Consejos para la medicacin en dermatologa: Por favor, guarde las cajas en las que vienen los medicamentos de uso tpico para ayudarle a seguir las instrucciones sobre dnde y cmo usarlos. Las farmacias generalmente imprimen las instrucciones del medicamento slo en las cajas y no directamente en los tubos del [place].   Si su medicamento es muy caro, por favor, pngase en contacto con Rolm Gala llamando al [phone] y presione la opcin 4 o envenos un mensaje a travs de Clinical cytogeneticist.   No podemos decirle cul ser su copago por los medicamentos por adelantado ya que esto es diferente dependiendo de la cobertura de su seguro. Sin embargo, es posible que podamos encontrar un medicamento sustituto a Audiological scientist un formulario para que el seguro cubra el medicamento que se considera necesario.   Si se requiere una autorizacin previa para que su  compaa de seguros [place] su medicamento, por favor permtanos de 1 a 2 das hbiles para completar [address]  Los precios de los medicamentos varan con frecuencia dependiendo del Environmental consultant de dnde se surte la receta y alguna farmacias pueden ofrecer precios ms baratos.  El sitio web www.goodrx.com tiene cupones para medicamentos de Health and safety inspector. Los precios aqu no tienen en cuenta lo que podra costar con la ayuda del seguro (puede ser ms barato con su seguro), pero el sitio web puede darle el precio si no utiliz Tourist information centre manager.  - Puede imprimir el cupn correspondiente y llevarlo con su receta a la farmacia.  - Tambin puede pasar por nuestra oficina durante el horario de atencin regular y Education officer, museum una tarjeta de cupones de GoodRx.  - Si necesita que su receta se enve electrnicamente a una farmacia diferente, informe a nuestra oficina a travs de MyChart de [place] o por telfono llamando al [phone] y presione la opcin 4.

## 2021-06-04 NOTE — Progress Notes (Signed)
° °  Follow-Up Visit   Subjective  Krista Farley is a 30 y.o. female who presents for the following: Acne (Patient here for acne follow up. Patient started isotretinoin therapy but has not been seen in 3 months. ).  The following portions of the chart were reviewed this encounter and updated as appropriate:   Tobacco   Allergies   Meds   Problems   Med Hx   Surg Hx   Fam Hx       Review of Systems:  No other skin or systemic complaints except as noted in HPI or Assessment and Plan.  Objective  Well appearing patient in no apparent distress; mood and affect are within normal limits.  A focused examination was performed including face, neck, chest and back. Relevant physical exam findings are noted in the Assessment and Plan.  face Face with rare comedone, few inflammatory papules Chest and back are clear   Assessment & Plan  Acne vulgaris face  Chronic condition with duration or expected duration over one year. Condition is bothersome to patient. Not currently at goal.  Has been on isotretinoin but ran out due to delay in her appointment - She had COVID and had to reschedule. Now we have to re-register her in iPledge and wait 30 days before restarting  Restart doxycycline 20 mg twice daily with food x 25 days  Will plan to restart isotretinoin in 30 days. She will do a nurse visit for urine pregnancy test then followed by doctor's visit 30 days after that for isotretinoin f/u.  Urine pregnancy test performed in office today and was negative.  Patient demonstrates comprehension and confirms she will not get pregnant.   Patient re-registered in iPledge program.   doxycycline (PERIOSTAT) 20 MG tablet - face Take 1 tablet (20 mg total) by mouth 2 (two) times daily. Take with food  Related Medications norethindrone-ethinyl estradiol (MICROGESTIN) 1-20 MG-MCG tablet Take 1 tablet by mouth daily.  While taking Isotretinoin and for 30 days after you finish the medication,  do not get pregnant, do not share pills, do not donate blood.  Generic isotretinoin is best absorbed when taken with a fatty meal. Isotretinoin can make you sensitive to the sun. Daily careful sun protection including sunscreen SPF 30+ when outdoors is recommended.  Return in about 30 days (around 07/04/2021) for Isotretinoin with nurse for urine pregnancy, 2 months with Dr. Neale Burly.  Anise Salvo, RMA, am acting as scribe for Darden Dates, MD .   Documentation: I have reviewed the above documentation for accuracy and completeness, and I agree with the above.  Darden Dates, MD

## 2021-07-07 ENCOUNTER — Ambulatory Visit: Payer: Medicaid Other

## 2021-07-21 ENCOUNTER — Telehealth: Payer: Self-pay

## 2021-07-21 NOTE — Telephone Encounter (Signed)
Thank you! Please start her at 40 mg daily and ask her to let us know if she is getting too dry. Please also be sure she has been off of the doxycycline for at least 5 days before starting the isotretinoin again and that she knows not to take them together. Thank you!

## 2021-07-21 NOTE — Telephone Encounter (Signed)
Patient came in today for urine pregnancy test.   Pregnancy test negative.   What dosing needs to be sent in for patient's accutane?

## 2021-07-22 NOTE — Telephone Encounter (Signed)
Called patient this morning with interpreter services this morning. Had to leave a message to return call. AW  We all need to confirm birth control methods. Previous methods used were tubal sterilization and abstinence.

## 2021-07-23 MED ORDER — ISOTRETINOIN 40 MG PO CAPS: 40.0000 mg | ORAL_CAPSULE | Freq: Every day | ORAL | 0 refills | Status: DC

## 2021-07-23 NOTE — Telephone Encounter (Signed)
Spouse returned call. Confirmed Abstinence as form on birth control for ipledge. RX sent in and patient is required to demonstrate comprehension. Advised that patient needs to answer questions today or tomorrow and speak with pharmacy to have medication shipped out or she would be locked out again.

## 2021-07-23 NOTE — Telephone Encounter (Signed)
Used interpreter services this morning to call patient. Left second message to return call. aw

## 2021-08-06 ENCOUNTER — Ambulatory Visit: Payer: Self-pay | Admitting: Dermatology

## 2021-08-21 ENCOUNTER — Ambulatory Visit (INDEPENDENT_AMBULATORY_CARE_PROVIDER_SITE_OTHER): Payer: Medicaid Other | Admitting: Dermatology

## 2021-08-21 ENCOUNTER — Other Ambulatory Visit: Payer: Self-pay

## 2021-08-21 ENCOUNTER — Telehealth: Payer: Self-pay

## 2021-08-21 DIAGNOSIS — L853 Xerosis cutis: Secondary | ICD-10-CM | POA: Diagnosis not present

## 2021-08-21 DIAGNOSIS — Z79899 Other long term (current) drug therapy: Secondary | ICD-10-CM

## 2021-08-21 DIAGNOSIS — K130 Diseases of lips: Secondary | ICD-10-CM | POA: Diagnosis not present

## 2021-08-21 DIAGNOSIS — L700 Acne vulgaris: Secondary | ICD-10-CM | POA: Diagnosis not present

## 2021-08-21 MED ORDER — NORETHINDRONE ACET-ETHINYL EST 1-20 MG-MCG PO TABS: 1.0000 | ORAL_TABLET | Freq: Every day | ORAL | 9 refills | Status: AC

## 2021-08-21 MED ORDER — ABSORICA LD 32 MG PO CAPS: 1.0000 | ORAL_CAPSULE | Freq: Every day | ORAL | 0 refills | Status: DC

## 2021-08-21 NOTE — Progress Notes (Deleted)
? ?  Follow-Up Visit ?  ?Subjective  ?Krista Farley is a 31 y.o. female who presents for the following: Acne (Patient here today for acne follow up. Patient started isotretinoin but is not currently taking any. ). ? ? ?The following portions of the chart were reviewed this encounter and updated as appropriate:  ?  ?  ? ?Review of Systems:  No other skin or systemic complaints except as noted in HPI or Assessment and Plan. ? ?Objective  ?Well appearing patient in no apparent distress; mood and affect are within normal limits. ? ?A focused examination was performed including face, neck, chest and back. Relevant physical exam findings are noted in the Assessment and Plan. ? ?face ?Trace open comedones, few resolving inflammatory papules at face ?Chest is clear ?Back with rare inflammatory papule ? ? ? ?Assessment & Plan  ?Acne vulgaris ?face ? ?[facility] ?iPLEDGE # [id number] ?2 forms of BC: ?Tubal ligation ?Oral Contraceptive  ? ?Absorica LD 32 mg once daily ? ?Related Medications ?norethindrone-ethinyl estradiol (MICROGESTIN) 1-20 MG-MCG tablet ?Take 1 tablet by mouth daily. ? ? ?Return in about 30 days (around 09/20/2021) for Isotretinoin. ? ?Anise Salvo, RMA, am acting as scribe for Darden Dates, MD . ? ?

## 2021-08-21 NOTE — Telephone Encounter (Signed)
Patient came in shortly after 07/23/21 pregnancy test to let us know she is not abstinence as confirmed by spouse. When calling ipledge at that time I was advised nothing could be done to correct this besides sending a "appeal letter". Letter was approved by Dr. Neale Burly and faxed the following day, copy of this letter is in media. I called ipledge today to get an update on this patient's case and Roselyn Meier (Ref ID [id number]) states this patient never showed as abstinence. She had no past information from 07/24/2021. Patient is registered in ipledge program as oral birth control pills and tubial sterilization. Patient can be confirmed at today's appointment and medication can be sent in. aw  ?

## 2021-08-21 NOTE — Progress Notes (Signed)
? ?  Isotretinoin Follow-Up Visit ?  ?Subjective  ?Krista Farley is a 31 y.o. female who presents for the following: Acne (Patient here today for acne follow up. Patient started isotretinoin but is not currently taking any. ). ? ?Week # 16 ?[facility] ?iPLEDGE # [id number] ?2 forms of BC: ?Tubal ligation ?Oral Contraceptive  ? ?Total mg/kg - 50.58 mg/kg ? ? Isotretinoin F/U - 08/21/21 1400   ? ?  ? Isotretinoin Follow Up  ? iPledge # [id number]   ? Date 08/21/21   ? Two Forms of Birth Control Tubal Sterilization;Oral Contraceptives (w/ estrogen)   ? ?  ?  ? ?  ? ? ?Side effects: Dry skin, dry lips ? ?Denies changes in night vision, shortness of breath, abdominal pain, nausea, vomiting, diarrhea, blood in stool or urine, visual changes, headaches, epistaxis, joint pain, myalgias, mood changes, depression, or suicidal ideation.  ? ?Patient is not pregnant, not seeking pregnancy, and not breastfeeding. ? ? ?The following portions of the chart were reviewed this encounter and updated as appropriate: medications, allergies, medical history ? ?Review of Systems:  No other skin or systemic complaints except as noted in HPI or Assessment and Plan. ? ?Objective  ?Well appearing patient in no apparent distress; mood and affect are within normal limits. ? ?An examination of the face, neck, chest, and back was performed and relevant findings are noted below.  ? ?face ?Trace open comedones, few resolving inflammatory papules at face ?Chest is clear ?Back with rare inflammatory papule ? ? ? ?Assessment & Plan  ? ?Acne vulgaris ?face ? ?Severe and Chronic (present >1 year); currently on Isotretinoin and not to goal (must reach target dose based on weight and also have clear skin for 2 months prior to discontinuation in order to help prevent relapse) ? ?[place] ?iPLEDGE # [id number] ?2 forms of BC: ?Tubal ligation ?Oral Contraceptive  ? ?Absorica LD 32 mg once daily ? ?Urine pregnancy test performed in  office today and was negative.  Patient demonstrates comprehension and confirms she will not get pregnant.  ? ?Patient confirmed in [place] and isotretinoin sent to pharmacy. ? ? ?ISOtretinoin Micronized (ABSORICA LD) 32 MG CAPS - face ?Take 1 capsule by mouth daily. ? ?Related Medications ?norethindrone-ethinyl estradiol (MICROGESTIN) 1-20 MG-MCG tablet ?Take 1 tablet by mouth daily. ? ? ? ?Xerosis secondary to isotretinoin therapy ?- Continue emollients as directed ? ?Cheilitis secondary to isotretinoin therapy ?- Continue lip balm as directed, Dr. Luvenia Heller Cortibalm recommended ? ?Long term medication management (isotretinoin) ?- While taking Isotretinoin and for 30 days after you finish the medication, do not get pregnant, do not share pills, do not donate blood. Isotretinoin is best absorbed when taken with a fatty meal. Isotretinoin can make you sensitive to the sun. Daily careful sun protection including sunscreen SPF 30+ when outdoors is recommended. ? ?Follow-up in 30 days. ? ?Graciella Belton, RMA, am acting as scribe for Forest Gleason, MD . ? ?Documentation: I have reviewed the above documentation for accuracy and completeness, and I agree with the above. ? ?Forest Gleason, MD ? ? ?

## 2021-08-21 NOTE — Patient Instructions (Signed)

## 2021-08-24 ENCOUNTER — Encounter: Payer: Self-pay | Admitting: Dermatology

## 2021-09-22 ENCOUNTER — Ambulatory Visit (INDEPENDENT_AMBULATORY_CARE_PROVIDER_SITE_OTHER): Payer: Medicaid Other | Admitting: Dermatology

## 2021-09-22 VITALS — Wt 175.0 lb

## 2021-09-22 DIAGNOSIS — K130 Diseases of lips: Secondary | ICD-10-CM

## 2021-09-22 DIAGNOSIS — L853 Xerosis cutis: Secondary | ICD-10-CM | POA: Diagnosis not present

## 2021-09-22 DIAGNOSIS — L700 Acne vulgaris: Secondary | ICD-10-CM | POA: Diagnosis not present

## 2021-09-22 DIAGNOSIS — Z79899 Other long term (current) drug therapy: Secondary | ICD-10-CM | POA: Diagnosis not present

## 2021-09-22 MED ORDER — ISOTRETINOIN 30 MG PO CAPS: 60.0000 mg | ORAL_CAPSULE | Freq: Every day | ORAL | 0 refills | Status: DC

## 2021-09-22 NOTE — Progress Notes (Signed)
? ?Isotretinoin Follow-Up Visit ?  ?Subjective  ?Krista Farley is a 31 y.o. female who presents for the following: Follow-up (Patient here today follow up on isotretinoin follow up. Patient is currently on week 20 and taking absorbica ld 32 mg by mouth daily. She reports some dry skin, dry lip, dry nose, and dry eyes.). ? ?Week # 20 ? ? Isotretinoin F/U - 09/22/21 1600   ? ?  ? Isotretinoin Follow Up  ? iPledge # [id number]   ? Date 09/22/21   ? Weight 175 lb (79.4 kg)   ? Two Forms of Birth Control Tubal Sterilization;Oral Contraceptives (w/ estrogen)   ? Acne breakouts since last visit? Yes   ?  ? Dosage  ? Target Dosage (mg) 11565   ? Current (To Date) Dosage (mg) 4860   ? To Go Dosage (mg) 6705   ?  ? Side Effects  ? Skin Dry Skin;Dry Lips;Dry Eyes   ? Gastrointestinal WNL   ? Neurological WNL   ? Constitutional WNL   ? ?  ?  ? ?  ? ?Side effects: Dry skin, dry lips ? ?Denies changes in night vision, shortness of breath, abdominal pain, nausea, vomiting, diarrhea, blood in stool or urine, visual changes, headaches, epistaxis, joint pain, myalgias, mood changes, depression, or suicidal ideation.  ? ?Patient is not pregnant, not seeking pregnancy, and not breastfeeding. ? ?The following portions of the chart were reviewed this encounter and updated as appropriate: medications, allergies, medical history ? ?Review of Systems:  No other skin or systemic complaints except as noted in HPI or Assessment and Plan. ? ?Objective  ?Well appearing patient in no apparent distress; mood and affect are within normal limits. ? ?An examination of the face, neck, chest, and back was performed and relevant findings are noted below.  ? ?face ?Pink spotty macules and comedones at face.  ? ? ?Assessment & Plan  ? ?Acne vulgaris ?face ?Chronic and persistent condition with duration or expected duration over one year. Condition is symptomatic/ bothersome to patient. Not currently at goal. ?Increase from 32 mg to 60  mg. ?Isotretinoin 30 mg capsule - take 2 capsules by mouth daily. Take both with food  ?Patient confirmed in iPledge and isotretinoin sent to pharmacy. ? ?[place] ?iPLEDGE # [id number] ?2 forms of BC: ?Tubal ligation ?Oral Contraceptive  ?4860 total mg  ?61.21 kg per mg ? ?Urine pregnancy test performed in office today and was negative.  Patient demonstrates comprehension and confirms she will not get pregnant.  ?HCG urine Test Dipstick ?Distributed by [employer].  ?Lot: [id number] Exp 2022-11-20 ? ?Acne is Severe; chronic and persistent; not at goal. ?Patient is on Isotretinoin -  requiring FDA mandated monthly evaluations and laboratory monitoring. ? ?While taking Isotretinoin and for 30 days after you finish the medication, do not get pregnant, do not share pills, do not donate blood.  Generic isotretinoin is best absorbed when taken with a fatty meal. Isotretinoin can make you sensitive to the sun. Daily careful sun protection including sunscreen SPF 30+ when outdoors is recommended. ? ?ISOtretinoin (ACCUTANE) 30 MG capsule - face ?Take 2 capsules (60 mg total) by mouth daily. Take with food ? ?Xerosis secondary to isotretinoin therapy ?- Continue emollients as directed ? ?Cheilitis secondary to isotretinoin therapy ?- Continue lip balm as directed, Dr. Luvenia Heller Cortibalm recommended ? ?Long term medication management (isotretinoin) ?- While taking Isotretinoin and for 30 days after you finish the medication, do not get pregnant,  do not share pills, do not donate blood. Isotretinoin is best absorbed when taken with a fatty meal. Isotretinoin can make you sensitive to the sun. Daily careful sun protection including sunscreen SPF 30+ when outdoors is recommended. ? ?Follow-up in 30 days. ?I, Ruthell Rummage, CMA, am acting as scribe for Sarina Ser, MD. ?Documentation: I have reviewed the above documentation for accuracy and completeness, and I agree with the above. ? ?Sarina Ser,  MD ? ?

## 2021-09-22 NOTE — Patient Instructions (Addendum)
If you are bothered by increase in dose you can take 1 capsule daily. Please call and let us know if you choose to do this.  ? ? ? ?While taking Isotretinoin and for 30 days after you finish the medication, do not get pregnant, do not share pills, do not donate blood.  Generic isotretinoin is best absorbed when taken with a fatty meal. Isotretinoin can make you sensitive to the sun. Daily careful sun protection including sunscreen SPF 30+ when outdoors is recommended. ? ?If You Need Anything After Your Visit ? ?If you have any questions or concerns for your doctor, please call our main line at [phone] and press option 4 to reach your doctor's medical assistant. If no one answers, please leave a voicemail as directed and we will return your call as soon as possible. Messages left after 4 pm will be answered the following business day.  ? ?You may also send [place] a message via MyChart. We typically respond to MyChart messages within 1-2 business days. ? ?For prescription refills, please ask your pharmacy to contact our office. Our fax number is [phone]. ? ?If you have an urgent issue when the clinic is closed that cannot wait until the next business day, you can page your doctor at the number below.   ? ?Please note that while we do our best to be available for urgent issues outside of office hours, we are not available 24/7.  ? ?If you have an urgent issue and are unable to reach [place], you may choose to seek medical care at your doctor's office, retail clinic, urgent care center, or emergency room. ? ?If you have a medical emergency, please immediately call 911 or go to the emergency department. ? ?Pager Numbers ? ?- Dr. Gwen Pounds: [phone] ? ?- Dr. Neale Burly: [phone] ? ?- Dr. Roseanne Reno: [phone] ? ?In the event of inclement weather, please call our main line at [phone] for an update on the status of any delays or closures. ? ?Dermatology Medication Tips: ?Please keep the boxes that topical medications  come in in order to help keep track of the instructions about where and how to use these. Pharmacies typically print the medication instructions only on the boxes and not directly on the medication tubes.  ? ?If your medication is too expensive, please contact our office at [phone] option 4 or send [place] a message through MyChart.  ? ?We are unable to tell what your co-pay for medications will be in advance as this is different depending on your insurance coverage. However, we may be able to find a substitute medication at lower cost or fill out paperwork to get insurance to cover a needed medication.  ? ?If a prior authorization is required to get your medication covered by your insurance company, please allow [place] 1-2 business days to complete this process. ? ?Drug prices often vary depending on where the prescription is filled and some pharmacies may offer cheaper prices. ? ?The website www.goodrx.com contains coupons for medications through different pharmacies. The prices here do not account for what the cost may be with help from insurance (it may be cheaper with your insurance), but the website can give you the price if you did not use any insurance.  ?- You can print the associated coupon and take it with your prescription to the pharmacy.  ?- You may also stop by our office during regular business hours and pick up a GoodRx coupon card.  ?- If you need your prescription sent  electronically to a different pharmacy, notify our office through [facility] or by phone at [phone] option 4. ? ? ? ? ?Si Usted Necesita Algo Despu?s de Su Visita ? ?Tambi?n puede enviarnos un mensaje a trav?s de MyChart. Por lo general respondemos a los mensajes de MyChart en el transcurso de 1 a 2 d?as h?biles. ? ?Para renovar recetas, por favor pida a su farmacia que se ponga en contacto con nuestra oficina. Nuestro n?mero de fax es el [phone]. ? ?Si tiene un asunto urgente cuando la cl?nica est? cerrada y que no  puede esperar hasta el siguiente d?a h?bil, puede llamar/localizar a su doctor(a) al n?mero que aparece a continuaci?n.  ? ?Por favor, tenga en cuenta que aunque hacemos todo lo posible para estar disponibles para asuntos urgentes fuera del horario de oficina, no estamos disponibles las 24 horas del d?a, los 7 d?as de la semana.  ? ?Si tiene un problema urgente y no puede comunicarse con nosotros, puede optar por buscar atenci?n m?dica  en el consultorio de su doctor(a), en una cl?nica privada, en un centro de atenci?n urgente o en una sala de emergencias. ? ?Si tiene Radio broadcast assistant m?dica, por favor llame inmediatamente al 911 o vaya a la sala de emergencias. ? ?N?meros de b?per ? ?- Dr. Gwen Pounds: [phone] ? ?- Dra. Moye: [phone] ? ?- Dra. Roseanne Reno: [phone] ? ?En caso de inclemencias del tiempo, por favor llame a nuestra l?nea principal al [phone] para una actualizaci?n sobre el estado de cualquier retraso o cierre. ? ?Consejos para la medicaci?n en dermatolog?a: ?Por favor, guarde las cajas en las que vienen los medicamentos de uso t?pico para ayudarle a seguir las instrucciones sobre d?nde y c?mo usarlos. Las farmacias generalmente imprimen las instrucciones del medicamento s?lo en las cajas y no directamente en los tubos del [place].  ? ?Si su medicamento es muy caro, por favor, p?ngase en contacto con Rolm Gala llamando al [phone] y presione la opci?n 4 o env?enos un mensaje a trav?s de MyChart.  ? ?No podemos decirle cu?l ser? su copago por los medicamentos por adelantado ya que esto es diferente dependiendo de la cobertura de su seguro. Sin embargo, es posible que podamos encontrar un medicamento sustituto a Audiological scientist un formulario para que el seguro cubra el medicamento que se considera necesario.  ? ?Si se requiere Neomia Dear autorizaci?n previa para que su compa??a de seguros [place] su medicamento, por favor perm?tanos de 1 a 2 d?as h?biles para completar este  proceso. ? ?Los precios de los medicamentos var?an con frecuencia dependiendo del Environmental consultant de d?nde se surte la receta y alguna farmacias pueden ofrecer precios m?s baratos. ? ?El sitio web www.goodrx.com tiene cupones para medicamentos de Health and safety inspector. Los precios aqu? no tienen en cuenta lo que podr?a costar con la ayuda del seguro (puede ser m?s barato con su seguro), pero el sitio web puede darle el precio si no utiliz? ning?n seguro.  ?- Puede imprimir el cup?n correspondiente y llevarlo con su receta a la farmacia.  ?- Tambi?n puede pasar por nuestra oficina durante el horario de atenci?n regular y recoger una tarjeta de cupones de GoodRx.  ?- Si necesita que su receta se env?e electr?nicamente a Psychiatrist, informe a nuestra oficina a trav?s de MyChart de [place] o por tel?fono llamando al [phone] y presione la opci?n 4. ? ?

## 2021-09-23 ENCOUNTER — Encounter: Payer: Self-pay | Admitting: Dermatology

## 2021-10-23 ENCOUNTER — Ambulatory Visit (INDEPENDENT_AMBULATORY_CARE_PROVIDER_SITE_OTHER): Payer: Medicaid Other | Admitting: Dermatology

## 2021-10-23 VITALS — Wt 175.0 lb

## 2021-10-23 DIAGNOSIS — L853 Xerosis cutis: Secondary | ICD-10-CM | POA: Diagnosis not present

## 2021-10-23 DIAGNOSIS — Z79899 Other long term (current) drug therapy: Secondary | ICD-10-CM | POA: Diagnosis not present

## 2021-10-23 DIAGNOSIS — L700 Acne vulgaris: Secondary | ICD-10-CM | POA: Diagnosis not present

## 2021-10-23 DIAGNOSIS — K130 Diseases of lips: Secondary | ICD-10-CM

## 2021-10-23 NOTE — Patient Instructions (Signed)

## 2021-10-23 NOTE — Progress Notes (Signed)
Isotretinoin Follow-Up Visit   Subjective  Krista Farley is a 31 y.o. female who presents for the following: Acne (Face, wk 24 Isotretinoin, Isotretinoin 30mg  2 po qd).  Patient accompanied by translator.  Week # 24   Isotretinoin F/U - 10/23/21 1600       Isotretinoin Follow Up   iPledge # 12/23/21    Date 10/23/21    Weight 175 lb (79.4 kg)    Two Forms of Birth Control Tubal Sterilization;Oral Contraceptives (w/ estrogen)    Acne breakouts since last visit? No      Dosage   Target Dosage (mg) 11910    Current (To Date) Dosage (mg) 6660    To Go Dosage (mg) 5250      Side Effects   Skin Chapped Lips;Dry Lips;Eye Irritation    Gastrointestinal WNL    Neurological WNL    Constitutional WNL             Side effects: Dry skin, dry lips  Denies changes in night vision, shortness of breath, abdominal pain, nausea, vomiting, diarrhea, blood in stool or urine, visual changes, headaches, epistaxis, joint pain, myalgias, mood changes, depression, or suicidal ideation.   Patient is not pregnant, not seeking pregnancy, and not breastfeeding.  The following portions of the chart were reviewed this encounter and updated as appropriate: medications, allergies, medical history  Review of Systems:  No other skin or systemic complaints except as noted in HPI or Assessment and Plan.  Objective  Well appearing patient in no apparent distress; mood and affect are within normal limits.  An examination of the face, neck, chest, and back was performed and relevant findings are noted below.   face Hyperpigmented macules cheeks   Assessment & Plan   Acne vulgaris face Wk 24 Isotretinoin [facility] 2 forms of BC: Tubal ligation, Oral Contraceptive  Total mg = 6,660 Total mg/kg = 83.9mg /kg  Pending labs cont Isotretinoin 30mg  2 po qd with fatty meal  Acne is Severe; chronic and persistent; not at goal. Patient is on Isotretinoin -   requiring FDA mandated monthly evaluations and laboratory monitoring.  While taking Isotretinoin and for 30 days after you finish the medication, do not get pregnant, do not share pills, do not donate blood.  Generic isotretinoin is best absorbed when taken with a fatty meal. Isotretinoin can make you sensitive to the sun. Daily careful sun protection including sunscreen SPF 30+ when outdoors is recommended.   Related Procedures Comprehensive metabolic panel Lipid panel hCG, serum, qualitative  Related Medications norethindrone-ethinyl estradiol (MICROGESTIN) 1-20 MG-MCG tablet Take 1 tablet by mouth daily.  ISOtretinoin (ACCUTANE) 30 MG capsule Take 2 capsules (60 mg total) by mouth daily. Take with food  Xerosis secondary to isotretinoin therapy - Continue emollients as directed  Cheilitis secondary to isotretinoin therapy - Continue lip balm as directed, Dr. [phone] Cortibalm recommended  Long term medication management (isotretinoin) - While taking Isotretinoin and for 30 days after you finish the medication, do not get pregnant, do not share pills, do not donate blood. Isotretinoin is best absorbed when taken with a fatty meal. Isotretinoin can make you sensitive to the sun. Daily careful sun protection including sunscreen SPF 30+ when outdoors is recommended.  Follow-up in 30 days.  I, , RMA, am acting as scribe for Clayborne Artist, MD . Documentation: I have reviewed the above documentation for accuracy and completeness, and I agree with the above.  Ardis Rowan, MD

## 2021-10-28 ENCOUNTER — Telehealth: Payer: Self-pay

## 2021-10-28 DIAGNOSIS — L700 Acne vulgaris: Secondary | ICD-10-CM

## 2021-10-28 LAB — COMPREHENSIVE METABOLIC PANEL
ALT: 14 IU/L (ref 0–32)
AST: 18 IU/L (ref 0–40)
Albumin/Globulin Ratio: 1.6 (ref 1.2–2.2)
Albumin: 4.3 g/dL (ref 3.8–4.8)
Alkaline Phosphatase: 101 IU/L (ref 44–121)
BUN/Creatinine Ratio: 14 (ref 9–23)
BUN: 10 mg/dL (ref 6–20)
Bilirubin Total: 0.2 mg/dL (ref 0.0–1.2)
CO2: 23 mmol/L (ref 20–29)
Calcium: 9.3 mg/dL (ref 8.7–10.2)
Chloride: 104 mmol/L (ref 96–106)
Creatinine, Ser: 0.71 mg/dL (ref 0.57–1.00)
Globulin, Total: 2.7 g/dL (ref 1.5–4.5)
Glucose: 89 mg/dL (ref 70–99)
Potassium: 4.4 mmol/L (ref 3.5–5.2)
Sodium: 140 mmol/L (ref 134–144)
Total Protein: 7 g/dL (ref 6.0–8.5)
eGFR: 116 mL/min/{1.73_m2} (ref >59)

## 2021-10-28 LAB — HCG, SERUM, QUALITATIVE: hCG,Beta Subunit,Qual,Serum: NEGATIVE m[IU]/mL (ref <6)

## 2021-10-28 LAB — LIPID PANEL
Chol/HDL Ratio: 6.5 ratio — ABNORMAL HIGH (ref 0.0–4.4)
Cholesterol, Total: 203 mg/dL — ABNORMAL HIGH (ref 100–199)
HDL: 31 mg/dL — ABNORMAL LOW (ref >39)
LDL Chol Calc (NIH): 120 mg/dL — ABNORMAL HIGH (ref 0–99)
Triglycerides: 297 mg/dL — ABNORMAL HIGH (ref 0–149)
VLDL Cholesterol Cal: 52 mg/dL — ABNORMAL HIGH (ref 5–40)

## 2021-10-28 MED ORDER — ISOTRETINOIN 30 MG PO CAPS: 60.0000 mg | ORAL_CAPSULE | Freq: Every day | ORAL | 0 refills | Status: AC

## 2021-10-28 NOTE — Telephone Encounter (Signed)
Discussed labs with pt, Isotretinoin 30 mg #60 sent to [facility]. ? ?

## 2021-10-28 NOTE — Telephone Encounter (Signed)
-----   Message from Deirdre Evener, MD sent at 10/28/2021  9:47 AM EDT ----- ?Lab from 10/27/2021 are OK. ?Chemistries including liver and kidney are all normal. ?Lipids elevated, but not significant enough to be a problem with current medication, Isotretinoin -- may want PCP to repeat after off Isotretinoin. ?Pregnancy test Negative. ? ?Continue Isotretinoin --send to pharmacy and contact pt. ?Keep fu appt ?

## 2021-11-02 ENCOUNTER — Inpatient Hospital Stay: Payer: Medicaid Other

## 2021-11-02 ENCOUNTER — Emergency Department: Payer: Medicaid Other

## 2021-11-02 ENCOUNTER — Other Ambulatory Visit: Payer: Self-pay

## 2021-11-02 ENCOUNTER — Encounter: Payer: Self-pay | Admitting: Emergency Medicine

## 2021-11-02 ENCOUNTER — Observation Stay
Admission: EM | Admit: 2021-11-02 | Discharge: 2021-11-03 | Disposition: A | Discharge status: Home / Self Care | Payer: Medicaid Other | Attending: Hospitalist | Admitting: Hospitalist

## 2021-11-02 DIAGNOSIS — R6510 Systemic inflammatory response syndrome (SIRS) of non-infectious origin without acute organ dysfunction: Secondary | ICD-10-CM | POA: Insufficient documentation

## 2021-11-02 DIAGNOSIS — R42 Dizziness and giddiness: Secondary | ICD-10-CM | POA: Insufficient documentation

## 2021-11-02 DIAGNOSIS — Z79899 Other long term (current) drug therapy: Secondary | ICD-10-CM | POA: Diagnosis not present

## 2021-11-02 DIAGNOSIS — M549 Dorsalgia, unspecified: Secondary | ICD-10-CM | POA: Diagnosis present

## 2021-11-02 DIAGNOSIS — Z793 Long term (current) use of hormonal contraceptives: Secondary | ICD-10-CM

## 2021-11-02 DIAGNOSIS — B349 Viral infection, unspecified: Principal | ICD-10-CM | POA: Diagnosis present

## 2021-11-02 DIAGNOSIS — Z20822 Contact with and (suspected) exposure to covid-19: Secondary | ICD-10-CM | POA: Insufficient documentation

## 2021-11-02 DIAGNOSIS — E869 Volume depletion, unspecified: Secondary | ICD-10-CM | POA: Diagnosis present

## 2021-11-02 DIAGNOSIS — A419 Sepsis, unspecified organism: Secondary | ICD-10-CM

## 2021-11-02 DIAGNOSIS — E871 Hypo-osmolality and hyponatremia: Secondary | ICD-10-CM | POA: Diagnosis not present

## 2021-11-02 DIAGNOSIS — Z1152 Encounter for screening for COVID-19: Secondary | ICD-10-CM | POA: Diagnosis not present

## 2021-11-02 DIAGNOSIS — E876 Hypokalemia: Secondary | ICD-10-CM | POA: Diagnosis not present

## 2021-11-02 DIAGNOSIS — R519 Headache, unspecified: Secondary | ICD-10-CM | POA: Diagnosis present

## 2021-11-02 LAB — CBC WITH DIFFERENTIAL/PLATELET
Abs Immature Granulocytes: 0.11 10*3/uL — ABNORMAL HIGH (ref 0.00–0.07)
Basophils Absolute: 0.1 10*3/uL (ref 0.0–0.1)
Basophils Relative: 0 %
Eosinophils Absolute: 0 10*3/uL (ref 0.0–0.5)
Eosinophils Relative: 0 %
HCT: 37.1 % (ref 36.0–46.0)
Hemoglobin: 12.2 g/dL (ref 12.0–15.0)
Immature Granulocytes: 1 %
Lymphocytes Relative: 10 %
Lymphs Abs: 1.6 10*3/uL (ref 0.7–4.0)
MCH: 26.7 pg (ref 26.0–34.0)
MCHC: 32.9 g/dL (ref 30.0–36.0)
MCV: 81.2 fL (ref 80.0–100.0)
Monocytes Absolute: 1.1 10*3/uL — ABNORMAL HIGH (ref 0.1–1.0)
Monocytes Relative: 7 %
Neutro Abs: 13.5 10*3/uL — ABNORMAL HIGH (ref 1.7–7.7)
Neutrophils Relative %: 82 %
Platelets: 199 10*3/uL (ref 150–400)
RBC: 4.57 MIL/uL (ref 3.87–5.11)
RDW: 13.3 % (ref 11.5–15.5)
WBC: 16.2 10*3/uL — ABNORMAL HIGH (ref 4.0–10.5)
nRBC: 0 % (ref 0.0–0.2)

## 2021-11-02 LAB — URINE DRUG SCREEN, QUALITATIVE (ARMC ONLY)
Amphetamines, Ur Screen: NOT DETECTED
Barbiturates, Ur Screen: NOT DETECTED
Benzodiazepine, Ur Scrn: NOT DETECTED
Cannabinoid 50 Ng, Ur ~~LOC~~: NOT DETECTED
Cocaine Metabolite,Ur ~~LOC~~: NOT DETECTED
MDMA (Ecstasy)Ur Screen: NOT DETECTED
Methadone Scn, Ur: NOT DETECTED
Opiate, Ur Screen: NOT DETECTED
Phencyclidine (PCP) Ur S: NOT DETECTED
Tricyclic, Ur Screen: NOT DETECTED

## 2021-11-02 LAB — RESP PANEL BY RT-PCR (FLU A&B, COVID) ARPGX2
Influenza A by PCR: NEGATIVE
Influenza B by PCR: NEGATIVE
SARS Coronavirus 2 by RT PCR: NEGATIVE

## 2021-11-02 LAB — COMPREHENSIVE METABOLIC PANEL
ALT: 13 U/L (ref 0–44)
AST: 16 U/L (ref 15–41)
Albumin: 3.8 g/dL (ref 3.5–5.0)
Alkaline Phosphatase: 86 U/L (ref 38–126)
Anion gap: 7 (ref 5–15)
BUN: 14 mg/dL (ref 6–20)
CO2: 23 mmol/L (ref 22–32)
Calcium: 8.6 mg/dL — ABNORMAL LOW (ref 8.9–10.3)
Chloride: 103 mmol/L (ref 98–111)
Creatinine, Ser: 0.8 mg/dL (ref 0.44–1.00)
GFR, Estimated: >60 mL/min (ref >60)
Glucose, Bld: 109 mg/dL — ABNORMAL HIGH (ref 70–99)
Potassium: 3.4 mmol/L — ABNORMAL LOW (ref 3.5–5.1)
Sodium: 133 mmol/L — ABNORMAL LOW (ref 135–145)
Total Bilirubin: 0.6 mg/dL (ref 0.3–1.2)
Total Protein: 7.9 g/dL (ref 6.5–8.1)

## 2021-11-02 LAB — URINALYSIS, COMPLETE (UACMP) WITH MICROSCOPIC
Bilirubin Urine: NEGATIVE
Glucose, UA: NEGATIVE mg/dL
Hgb urine dipstick: NEGATIVE
Ketones, ur: NEGATIVE mg/dL
Leukocytes,Ua: NEGATIVE
Nitrite: NEGATIVE
Protein, ur: NEGATIVE mg/dL
Specific Gravity, Urine: 1.023 (ref 1.005–1.030)
pH: 6 (ref 5.0–8.0)

## 2021-11-02 LAB — CSF CELL COUNT WITH DIFFERENTIAL
Eosinophils, CSF: 0 %
Lymphs, CSF: 86 %
Monocyte-Macrophage-Spinal Fluid: 13 %
RBC Count, CSF: 2 /mm3 (ref 0–3)
Segmented Neutrophils-CSF: 1 %
Tube #: 4
WBC, CSF: 2 /mm3 (ref 0–5)

## 2021-11-02 LAB — PROTEIN AND GLUCOSE, CSF
Glucose, CSF: 63 mg/dL (ref 40–70)
Total  Protein, CSF: 11 mg/dL — ABNORMAL LOW (ref 15–45)

## 2021-11-02 LAB — LACTIC ACID, PLASMA: Lactic Acid, Venous: 1.5 mmol/L (ref 0.5–1.9)

## 2021-11-02 LAB — HIV ANTIBODY (ROUTINE TESTING W REFLEX): HIV Screen 4th Generation wRfx: NONREACTIVE

## 2021-11-02 LAB — BASIC METABOLIC PANEL WITH GFR
Anion gap: 6 (ref 5–15)
BUN: 9 mg/dL (ref 6–20)
CO2: 22 mmol/L (ref 22–32)
Calcium: 9 mg/dL (ref 8.9–10.3)
Chloride: 111 mmol/L (ref 98–111)
Creatinine, Ser: 0.61 mg/dL (ref 0.44–1.00)
GFR, Estimated: >60 mL/min
Glucose, Bld: 151 mg/dL — ABNORMAL HIGH (ref 70–99)
Potassium: 3.8 mmol/L (ref 3.5–5.1)
Sodium: 139 mmol/L (ref 135–145)

## 2021-11-02 LAB — APTT: aPTT: 27 seconds (ref 24–36)

## 2021-11-02 LAB — PROCALCITONIN: Procalcitonin: <0.1 ng/mL

## 2021-11-02 LAB — POC URINE PREG, ED: Preg Test, Ur: NEGATIVE

## 2021-11-02 LAB — PROTIME-INR
INR: 1.2 (ref 0.8–1.2)
Prothrombin Time: 15.4 seconds — ABNORMAL HIGH (ref 11.4–15.2)

## 2021-11-02 LAB — ETHANOL: Alcohol, Ethyl (B): <10 mg/dL

## 2021-11-02 LAB — TSH: TSH: 0.515 u[IU]/mL (ref 0.350–4.500)

## 2021-11-02 LAB — C-REACTIVE PROTEIN: CRP: 10 mg/dL — ABNORMAL HIGH (ref <1.0)

## 2021-11-02 LAB — HEMOGLOBIN A1C
Hgb A1c MFr Bld: 5.2 % (ref 4.8–5.6)
Mean Plasma Glucose: 102.54 mg/dL

## 2021-11-02 LAB — MAGNESIUM: Magnesium: 2.4 mg/dL (ref 1.7–2.4)

## 2021-11-02 MED ORDER — ENOXAPARIN SODIUM 40 MG/0.4ML IJ SOSY
40.0000 mg | PREFILLED_SYRINGE | INTRAMUSCULAR | Status: DC
  Administered 2021-11-02: 40 mg via SUBCUTANEOUS
  Filled 2021-11-02: qty 0.4

## 2021-11-02 MED ORDER — ACETAMINOPHEN 325 MG PO TABS
650.0000 mg | ORAL_TABLET | ORAL | Status: DC
  Administered 2021-11-02 – 2021-11-03 (×4): 650 mg via ORAL
  Filled 2021-11-02 (×5): qty 2

## 2021-11-02 MED ORDER — ACETAMINOPHEN 10 MG/ML IV SOLN
1000.0000 mg | INTRAVENOUS | Status: AC
  Administered 2021-11-02: 1000 mg via INTRAVENOUS
  Filled 2021-11-02: qty 100

## 2021-11-02 MED ORDER — POTASSIUM CHLORIDE CRYS ER 20 MEQ PO TBCR
40.0000 meq | EXTENDED_RELEASE_TABLET | Freq: Once | ORAL | Status: AC
  Administered 2021-11-02: 40 meq via ORAL
  Filled 2021-11-02: qty 2

## 2021-11-02 MED ORDER — ALBUTEROL SULFATE (2.5 MG/3ML) 0.083% IN NEBU: 2.5000 mg | INHALATION_SOLUTION | RESPIRATORY_TRACT | Status: DC | PRN

## 2021-11-02 MED ORDER — SODIUM CHLORIDE 0.9 % IV SOLN
2.0000 g | Freq: Once | INTRAVENOUS | Status: AC
  Administered 2021-11-02: 2 g via INTRAVENOUS
  Filled 2021-11-02: qty 20

## 2021-11-02 MED ORDER — IOHEXOL 300 MG/ML  SOLN
100.0000 mL | Freq: Once | INTRAMUSCULAR | Status: AC | PRN
  Administered 2021-11-02: 100 mL via INTRAVENOUS

## 2021-11-02 MED ORDER — SODIUM CHLORIDE 0.9 % IV SOLN: INTRAVENOUS | Status: DC

## 2021-11-02 MED ORDER — SODIUM CHLORIDE 0.9 % IV SOLN
2.0000 g | INTRAVENOUS | Status: DC
  Filled 2021-11-02: qty 20

## 2021-11-02 MED ORDER — LACTATED RINGERS IV BOLUS
30.0000 mL/kg | Freq: Once | INTRAVENOUS | Status: AC
  Administered 2021-11-02: 2400 mL via INTRAVENOUS

## 2021-11-02 MED ORDER — ACETAMINOPHEN 325 MG PO TABS: 650.0000 mg | ORAL_TABLET | Freq: Four times a day (QID) | ORAL | Status: DC | PRN

## 2021-11-02 MED ORDER — IOHEXOL 300 MG/ML  SOLN
75.0000 mL | Freq: Once | INTRAMUSCULAR | Status: AC | PRN
  Administered 2021-11-02: 75 mL via INTRAVENOUS

## 2021-11-02 MED ORDER — VANCOMYCIN HCL IN DEXTROSE 1-5 GM/200ML-% IV SOLN
1000.0000 mg | Freq: Once | INTRAVENOUS | Status: AC
  Administered 2021-11-02: 1000 mg via INTRAVENOUS
  Filled 2021-11-02: qty 200

## 2021-11-02 MED ORDER — ACETAMINOPHEN 650 MG RE SUPP: 650.0000 mg | Freq: Four times a day (QID) | RECTAL | Status: DC | PRN

## 2021-11-02 MED ORDER — ONDANSETRON HCL 4 MG PO TABS: 4.0000 mg | ORAL_TABLET | Freq: Four times a day (QID) | ORAL | Status: DC | PRN

## 2021-11-02 MED ORDER — ONDANSETRON HCL 4 MG/2ML IJ SOLN: 4.0000 mg | Freq: Four times a day (QID) | INTRAMUSCULAR | Status: DC | PRN

## 2021-11-02 MED ORDER — LACTATED RINGERS IV SOLN: INTRAVENOUS | Status: AC

## 2021-11-02 MED ORDER — PROCHLORPERAZINE EDISYLATE 10 MG/2ML IJ SOLN
10.0000 mg | INTRAMUSCULAR | Status: AC
  Administered 2021-11-02: 10 mg via INTRAVENOUS
  Filled 2021-11-02: qty 2

## 2021-11-02 MED ORDER — DEXAMETHASONE SODIUM PHOSPHATE 10 MG/ML IJ SOLN
10.0000 mg | Freq: Once | INTRAMUSCULAR | Status: AC
  Administered 2021-11-02: 10 mg via INTRAVENOUS
  Filled 2021-11-02: qty 1

## 2021-11-02 NOTE — Progress Notes (Signed)
Elink following for sepsis protocol. 

## 2021-11-02 NOTE — Consult Note (Signed)
PHARMACY -  BRIEF ANTIBIOTIC NOTE  ? ?Pharmacy has received consult(s) for vancomycin from an ED provider. Patient is also ordered ceftriaxone. The patient's profile has been reviewed for ht/wt/allergies/indication/available labs.   ? ?One time order(s) placed for  ?--Vancomycin 2 g IV ? ?Further antibiotics/pharmacy consults should be ordered by admitting physician if indicated.       ?                ?Thank you, ?Tressie Ellis ?11/02/2021  10:40 AM  ?

## 2021-11-02 NOTE — H&P (Signed)
?History and Physical  ? ? ?Krista Farley RUE:[id number] DOB: 08/17/1990 DOA: 11/02/2021 ? ?PCP[facility]  ?Patient coming from: home ? ?I have personally briefly [facility] ? ?Chief Complaint: fever/chills/generalized weakness ? ?HPI: Krista Farley is a 31 y.o. female with no significant medical history who presents to ed with complaint of  ?HA, fever/chills /nausea/back pain pain and generalized weakness x 24 hours. Per husband who give most of history patient was well am of day PTA. He state that they attended a family gathering and later that evening at home patient became acutely ill. Per husband she was noted to have chills with temp of 103 at home. She was also noted to have generalized weakness. Due to patient having persistent symptoms this am she presented to ed. In ed patient was noted to meet criteria for sepsis with tachycardia, wbc, fever ,tachypnea and concern for source of infection. Patient work however noted on obvious infectious source however due to presentation. Patient did respond well to ivfs, tylenol, antibiotics and antiemetics. ? ?ED Course:  ?Temp 98.9, hr 120/71, hr 125 rr 18 ?Labs:  ?Wbc 16.2 with left shift,  hgb 12.2 ?NA 133 (140), K 3.4,cr0.8 ?Lactic 1.5 ?UA:  ?Resp: negative  ?Procal <0.1 ?Cxr:NAD ? ?CT abd ?1. No acute findings within the abdomen or pelvis. ?2. Simple appearing left ovary cyst measures 3.2 cm. No follow-up ?imaging is recommended. ?3. Left lower lobe lung nodule identified. This measures 4 mm. No ?routine follow-up recommended. ? ?CT head NAD ? ?Tx iv tylenol , compazine , LR 2.4L ? ?EKG: nsr low voltage ?Review of Systems: As per HPI otherwise 10 point review of systems negative.  ? ?History reviewed. No pertinent past medical history. ? ?Past Surgical History:  ?Procedure Laterality Date  ? CESAREAN SECTION    ? CESAREAN SECTION N/A 05/31/2015  ? Procedure: CESAREAN SECTION;   Surgeon: Ala DachJohanna K Halfon, MD;  Location: [facility] ORS;  Service: Obstetrics;  Laterality: N/A;  ? CESAREAN SECTION WITH BILATERAL TUBAL LIGATION Bilateral 11/08/2017  ? Procedure: CESAREAN SECTION WITH BILATERAL TUBAL LIGATION;  Surgeon: Christeen DouglasBeasley, Bethany, MD;  Location: [facility] ORS;  Service: Obstetrics;  Laterality: Bilateral;  Birth: [birth year] ?Wt: 6 lb 7 oz ?Sex: Female  ? ? ? reports that she has never smoked. She has never used smokeless tobacco. She reports that she does not drink alcohol and does not use drugs. ? ?No Known Allergies ? ?No family history on file. ? ?Prior to Admission medications   ?Medication Sig Start Date End Date Taking? Authorizing Provider  ?acetaminophen (TYLENOL) 325 MG tablet Take 2 tablets (650 mg total) by mouth every 4 (four) hours. 11/10/17   McVey, Prudencio Pairebecca A, CNM  ?bisacodyl (DULCOLAX) 10 MG suppository Place 1 suppository (10 mg total) rectally daily as needed for moderate constipation. 11/10/17   McVey, Prudencio Pairebecca A, CNM  ?DERMA-SMOOTHE/FS SCALP 0.01 % OIL Apply to scalp 1-2 times a day until improved. 11/27/20   Willeen NieceStewart, Tara, MD  ?ibuprofen (ADVIL) 600 MG tablet Take 1 tablet (600 mg total) by mouth every 6 (six) hours as needed. 05/06/20   Enid DerryWagner, Ashley, PA-C  ?ISOtretinoin (ACCUTANE) 30 MG capsule Take 2 capsules (60 mg total) by mouth daily. Take with food 10/28/21   Deirdre EvenerKowalski, David C, MD  ?ISOtretinoin (ACCUTANE) 40 MG capsule Take 1 capsule (40 mg total) by mouth daily. 07/23/21   Moye, [place], MD  ?ketoconazole (NIZORAL) 2 % shampoo Apply to scalp  at least 3 days a week 10/22/20   Moye, [place], MD  ?norethindrone-ethinyl estradiol (MICROGESTIN) 1-20 MG-MCG tablet Take 1 tablet by mouth daily. 08/21/21   Moye, [place], MD  ?senna-docusate (SENOKOT-S) 8.6-50 MG tablet Take 2 tablets by mouth 2 (two) times daily. 11/10/17   McVey, Prudencio Pair, CNM  ?simethicone (MYLICON) 80 MG chewable tablet Chew 1 tablet (80 mg total) by mouth 3 (three) times daily after meals. 11/10/17   McVey, Prudencio Pair, CNM   ?triamcinolone cream (KENALOG) 0.1 % apply twice daily as needed to affected areas for two weeks then apply twice daily on weekends only  ?Avoid applying to face, groin, and axilla. Use as directed. Risk of skin atrophy with long-term use reviewed. 12/24/20   Willeen Niece, MD  ? ? ?Physical Exam: ?Vitals:  ? 11/02/21 1100 11/02/21 1130 11/02/21 1200 11/02/21 1230  ?BP: 120/82 130/78 (!) 114/58 114/63  ?Pulse: (!) 126 (!) 104 97 92  ?Resp: (!) 23 18 16 18   ?Temp:      ?SpO2: 97% 100% 100% 100%  ?Weight:      ?Height:      ? ? ? ?Vitals:  ? 11/02/21 1100 11/02/21 1130 11/02/21 1200 11/02/21 1230  ?BP: 120/82 130/78 (!) 114/58 114/63  ?Pulse: (!) 126 (!) 104 97 92  ?Resp: (!) 23 18 16 18   ?Temp:      ?SpO2: 97% 100% 100% 100%  ?Weight:      ?Height:      ?Constitutional: NAD, calm, comfortable ?Eyes: PERRL, lids and conjunctivae normal ?ENMT: Mucous membranes are moist. Posterior pharynx clear of any exudate or lesions.Normal dentition.  ?Neck: normal, supple, no masses, no thyromegaly ?Respiratory: clear to auscultation bilaterally, no wheezing, no crackles. Normal respiratory effort. No accessory muscle use.  ?Cardiovascular: Regular rate and rhythm, no murmurs / rubs / gallops. No extremity edema. 2+ pedal pulses. No carotid bruits.  ?Abdomen: b/l lower quad tenderness, no masses palpated. No hepatosplenomegaly. Bowel sounds positive.  ?Musculoskeletal: no clubbing / cyanosis. No joint deformity upper and lower extremities. Good ROM, no contractures. Normal muscle tone.  ?Skin: no rashes, lesions, ulcers. No induration ?Neurologic: CN 2-12 grossly intact. Sensation intact, l. Strength 5/5 in all 4.  ?Psychiatric: Normal judgment and insight. Alert and oriented x 3. Normal mood.  ? ? ?Labs on Admission: I have personally reviewed following labs and imaging studies ? ?CBC: ?Recent Labs  ?Lab 11/02/21 ?0945  ?WBC 16.2*  ?NEUTROABS 13.5*  ?HGB 12.2  ?HCT 37.1  ?MCV 81.2  ?PLT 199  ? ?Basic Metabolic Panel: ?Recent Labs   ?Lab 10/27/21 ?1045 11/02/21 ?0945  ?NA 140 133*  ?K 4.4 3.4*  ?CL 104 103  ?CO2 23 23  ?GLUCOSE 89 109*  ?BUN 10 14  ?CREATININE 0.71 0.80  ?CALCIUM 9.3 8.6*  ? ?GFR: ?Estimated Creatinine Clearance: 103.3 mL/min (by C-G formula based on SCr of 0.8 mg/dL). ?Liver Function Tests: ?Recent Labs  ?Lab 10/27/21 ?1045 11/02/21 ?0945  ?AST 18 16  ?ALT 14 13  ?ALKPHOS 101 86  ?BILITOT 0.2 0.6  ?PROT 7.0 7.9  ?ALBUMIN 4.3 3.8  ? ?No results for input(s): LIPASE, AMYLASE in the last 168 hours. ?No results for input(s): AMMONIA in the last 168 hours. ?Coagulation Profile: ?Recent Labs  ?Lab 11/02/21 ?0945  ?INR 1.2  ? ?Cardiac Enzymes: ?No results for input(s): CKTOTAL, CKMB, CKMBINDEX, TROPONINI in the last 168 hours. ?BNP (last 3 results) ?No results for input(s): PROBNP in the last 8760 hours. ?HbA1C: ?No results for  input(s): HGBA1C in the last 72 hours. ?CBG: ?No results for input(s): GLUCAP in the last 168 hours. ?Lipid Profile: ?No results for input(s): CHOL, HDL, LDLCALC, TRIG, CHOLHDL, LDLDIRECT in the last 72 hours. ?Thyroid Function Tests: ?No results for input(s): TSH, T4TOTAL, FREET4, T3FREE, THYROIDAB in the last 72 hours. ?Anemia Panel: ?No results for input(s): VITAMINB12, FOLATE, FERRITIN, TIBC, IRON, RETICCTPCT in the last 72 hours. ?Urine analysis: ?   ?Component Value Date/Time  ? COLORURINE YELLOW (A) 11/02/2021 0945  ? APPEARANCEUR HAZY (A) 11/02/2021 0945  ? LABSPEC 1.023 11/02/2021 0945  ? PHURINE 6.0 11/02/2021 0945  ? GLUCOSEU NEGATIVE 11/02/2021 0945  ? HGBUR NEGATIVE 11/02/2021 0945  ? BILIRUBINUR NEGATIVE 11/02/2021 0945  ? KETONESUR NEGATIVE 11/02/2021 0945  ? PROTEINUR NEGATIVE 11/02/2021 0945  ? NITRITE NEGATIVE 11/02/2021 0945  ? LEUKOCYTESUR NEGATIVE 11/02/2021 0945  ? ? ?Radiological Exams on Admission: ?CT HEAD WO CONTRAST ( ) ? ?Result Date: 11/02/2021 ?CLINICAL DATA:  Headache, sudden and severe. EXAM: CT HEAD WITHOUT CONTRAST TECHNIQUE: Contiguous axial images were obtained from the  base of the skull through the vertex without intravenous contrast. RADIATION DOSE REDUCTION: This exam was performed according to the departmental dose-optimization program which includes automated exposure co

## 2021-11-02 NOTE — ED Notes (Signed)
Pt transported to CT via stretcher.  

## 2021-11-02 NOTE — ED Provider Notes (Signed)
? ?[facility] ?Provider Note ? ? ? Event Date/Time  ? First MD Initiated Contact with Patient 11/02/21 0935   ?  (approximate) ? ? ?History  ? ?Headache, Fever, Chills, Back Pain, and Dizziness ? ? ?HPI ? ?Krista Farley is a 31 y.o. female without significant past medical history who presents accompanied by her spouse for evaluation of 2 to 3 days of worsening headache, fevers measured up to 103 at home last night neck pain, back pain and some suprapubic discomfort as well.  Patient today.  No chest pain, cough, shortness of breath, sore throat, earache, rash or extremity pain.  No recent travel outside [place].  Patient does not take any daily medications.  She denies tobacco abuse EtOH use or illicit drug use.  No recent falls or injuries.  No rashes.  She has never had a headache like this before. ? ? ? ?  ?History reviewed. No pertinent past medical history. ? ? ?Physical Exam  ?Triage Vital Signs: ?ED Triage Vitals  ?Enc Vitals Group  ?   BP 11/02/21 0928 120/71  ?   Pulse Rate 11/02/21 0928 (!) 125  ?   Resp 11/02/21 0928 18  ?   Temp 11/02/21 0928 98.9 ?F (37.2 ?C)  ?   Temp src --   ?   SpO2 11/02/21 0928 99 %  ?   Weight 11/02/21 0929 176 lb 5.9 oz (80 kg)  ?   Height 11/02/21 0929 5\' 4"  (1.626 m)  ?   Head Circumference --   ?   Peak Flow --   ?   Pain Score 11/02/21 0928 8  ?   Pain Loc --   ?   Pain Edu? --   ?   Excl. in GC? --   ? ? ?Most recent vital signs: ?Vitals:  ? 11/02/21 1200 11/02/21 1230  ?BP: (!) 114/58 114/63  ?Pulse: 97 92  ?Resp: 16 18  ?Temp:    ?SpO2: 100% 100%  ? ? ?General: Awake, he is very uncomfortable. ?CV:  Good peripheral perfusion.  Tachycardic. ?Resp:  Normal effort.  Bilaterally. ?Abd:  No distention.  Some mild tenderness in the lower quadrants.  Some mild bilateral CVA tenderness.  No overlying skin changes. ?Other:  Nerves II to XII are grossly intact.  Patient has symmetric strength in upper and lower extremities.  No pronator drift or finger  dysmetria.  Sensation is intact to light touch all extremities.  Patient has some some neck stiffness. ? ?TMs and oropharynx are unremarkable. ? ? ?ED Results / Procedures / Treatments  ?Labs ?(all labs ordered are listed, but only abnormal results are displayed) ?Labs Reviewed  ?URINALYSIS, COMPLETE (UACMP) WITH MICROSCOPIC - Abnormal; Notable for the following components:  ?    Result Value  ? Color, Urine YELLOW (*)   ? APPearance HAZY (*)   ? Bacteria, UA RARE (*)   ? All other components within normal limits  ?COMPREHENSIVE METABOLIC PANEL - Abnormal; Notable for the following components:  ? Sodium 133 (*)   ? Potassium 3.4 (*)   ? Glucose, Bld 109 (*)   ? Calcium 8.6 (*)   ? All other components within normal limits  ?CBC WITH DIFFERENTIAL/PLATELET - Abnormal; Notable for the following components:  ? WBC 16.2 (*)   ? Neutro Abs 13.5 (*)   ? Monocytes Absolute 1.1 (*)   ? Abs Immature Granulocytes 0.11 (*)   ? All other components within normal limits  ?  PROTIME-INR - Abnormal; Notable for the following components:  ? Prothrombin Time 15.4 (*)   ? All other components within normal limits  ?PROTEIN AND GLUCOSE, CSF - Abnormal; Notable for the following components:  ? Total  Protein, CSF 11 (*)   ? All other components within normal limits  ?RESP PANEL BY RT-PCR (FLU A&B, COVID) ARPGX2  ?CSF CULTURE W GRAM STAIN  ?CULTURE, BLOOD (ROUTINE X 2)  ?CULTURE, BLOOD (ROUTINE X 2)  ?LACTIC ACID, PLASMA  ?PROCALCITONIN  ?APTT  ?CSF CELL COUNT WITH DIFFERENTIAL  ?HSV 1/2 PCR, CSF  ?POC URINE PREG, ED  ? ? ? ?EKG ? ?ECG is remarkable sinus tachycardia with a ventricular rate of 116 without evidence of acute ischemia or significant arrhythmia.  Normal axis. ? ? ?RADIOLOGY ? ?CT head on my interpretation without evidence of abscess, ischemia, edema, mass effect or other acute process.  I also reviewed radiologist interpretation. ? ?Chest reviewed by myself shows no focal consoidation, effusion, edema, pneumothorax or other  clear acute thoracic process. I also reviewed radiology interpretation and agree with findings described. ? ?CT abdomen pelvis my interpretation without evidence of diverticulitis, cystitis, perinephric stranding, kidney stone or other clear acute process.  I reviewed radiologist interpretation and agree with the findings of a simple appearing left ovarian cyst as well as a left lower lobe nodule without any other acute process. ? ? ? ?PROCEDURES: ? ?Critical Care performed: Yes, see critical care procedure note(s) ? ?.Critical Care ?Performed by: Gilles ChiquitoSmith, Alani Sabbagh P, MD ?Authorized by: Gilles ChiquitoSmith, Carver Murakami P, MD  ? ?Critical care provider statement:  ?  Critical care time (minutes):  30 ?  Critical care was necessary to treat or prevent imminent or life-threatening deterioration of the following conditions:  Sepsis ?  Critical care was time spent personally by me on the following activities:  Development of treatment plan with patient or surrogate, discussions with consultants, evaluation of patient's response to treatment, examination of patient, ordering and review of laboratory studies, ordering and review of radiographic studies, ordering and performing treatments and interventions, pulse oximetry, re-evaluation of patient's condition and review of old charts ?.Lumbar Puncture ? ?Date/Time: 11/02/2021 11:06 AM ?Performed by: Gilles ChiquitoSmith, Tiler Brandis P, MD ?Authorized by: Gilles ChiquitoSmith, Allysa Governale P, MD  ? ?Consent:  ?  Consent obtained:  Verbal and written (per patietn request husband translated after multiple offers for hospital transplator) ?  Consent given by:  Patient ?  Risks, benefits, and alternatives were discussed: yes   ?  Risks discussed:  Bleeding, headache, nerve damage, infection, pain and repeat procedure ?  Alternatives discussed:  No treatment ?Universal protocol:  ?  Patient identity confirmed:  Verbally with patient ?Pre-procedure details:  ?  Procedure purpose:  Diagnostic ?  Preparation: Patient was prepped and draped in  usual sterile fashion   ?Anesthesia:  ?  Anesthesia method:  Local infiltration ?  Local anesthetic:  Lidocaine 1% w/o epi ?Procedure details:  ?  Lumbar space:  L4-L5 interspace ?  Patient position:  Sitting ?  Ultrasound guidance: no   ?  Number of attempts:  1 ?  Fluid appearance:  Clear ?  Tubes of fluid:  4 ?Post-procedure details:  ?  Puncture site:  Adhesive bandage applied ?  Procedure completion:  Tolerated well, no immediate complications ?.1-3 Lead EKG Interpretation ?Performed by: Gilles ChiquitoSmith, Nikolis Berent P, MD ?Authorized by: Gilles ChiquitoSmith, Haniyah Maciolek P, MD  ? ?  Interpretation: normal   ?  ECG rate assessment: normal   ?  Rhythm: sinus  rhythm   ?  Ectopy: none   ?  Conduction: normal   ? ?The patient is on the cardiac monitor to evaluate for evidence of arrhythmia and/or significant heart rate changes. ? ? ?MEDICATIONS ORDERED IN ED: ?Medications  ?lactated ringers infusion ( Intravenous New Bag/Given 11/02/21 1103)  ?vancomycin (VANCOCIN) IVPB 1000 mg/200 mL premix (1,000 mg Intravenous New Bag/Given 11/02/21 1211)  ?lactated ringers bolus 2,400 mL (2,400 mLs Intravenous New Bag/Given 11/02/21 1015)  ?acetaminophen (OFIRMEV) IV 1,000 mg (0 mg Intravenous Stopped 11/02/21 1049)  ?prochlorperazine (COMPAZINE) injection 10 mg (10 mg Intravenous Given 11/02/21 1019)  ?iohexol (OMNIPAQUE) 300 MG/ML solution 100 mL (100 mLs Intravenous Contrast Given 11/02/21 1034)  ?dexamethasone (DECADRON) injection 10 mg (10 mg Intravenous Given 11/02/21 1056)  ?cefTRIAXone (ROCEPHIN) 2 g in sodium chloride 0.9 % 100 mL IVPB (0 g Intravenous Stopped 11/02/21 1105)  ?vancomycin (VANCOCIN) IVPB 1000 mg/200 mL premix (0 mg Intravenous Stopped 11/02/21 1211)  ?potassium chloride SA (KLOR-CON M) CR tablet 40 mEq (40 mEq Oral Given 11/02/21 1151)  ? ? ? ?IMPRESSION / MDM / ASSESSMENT AND PLAN / ED COURSE  ?I reviewed the triage vital signs and the nursing notes. ?             ?               ? ?Differential diagnosis includes, but is not limited to  autoimmune process, sepsis possibly from an acute abdominal source such as a cystitis pyelonephritis, diverticulitis, or appendicitis, metabolic derangements, possible meningitis. ? ?ECG is remarkable sinus tac

## 2021-11-02 NOTE — ED Triage Notes (Signed)
Pt husband reports pt with weakness, headache, fever, chills and back pain since yesterday. Denies recent exposures. Pt c/o pain in her back from her neck down. Pt speaking with eyes closed. ? ?MD in room to assess pt.  ?

## 2021-11-02 NOTE — Progress Notes (Signed)
CODE SEPSIS - PHARMACY COMMUNICATION ? ?**Broad Spectrum Antibiotics should be administered within 1 hour of Sepsis diagnosis** ? ?Time Code Sepsis Called/Page Received: [id number] ? ?Antibiotics Ordered: Ceftriaxone + vancomycin ? ?Time of 1st antibiotic administration: 1055 ? ?Additional action taken by pharmacy: N/A ? ?Benita Gutter ?11/02/2021  10:41 AM  ?

## 2021-11-02 NOTE — ED Notes (Signed)
Pt ambulated to BR with steady gait.

## 2021-11-03 DIAGNOSIS — E871 Hypo-osmolality and hyponatremia: Secondary | ICD-10-CM | POA: Diagnosis not present

## 2021-11-03 DIAGNOSIS — R6510 Systemic inflammatory response syndrome (SIRS) of non-infectious origin without acute organ dysfunction: Secondary | ICD-10-CM

## 2021-11-03 DIAGNOSIS — E876 Hypokalemia: Secondary | ICD-10-CM | POA: Diagnosis not present

## 2021-11-03 LAB — COMPREHENSIVE METABOLIC PANEL
ALT: 13 U/L (ref 0–44)
AST: 14 U/L — ABNORMAL LOW (ref 15–41)
Albumin: 3.6 g/dL (ref 3.5–5.0)
Alkaline Phosphatase: 78 U/L (ref 38–126)
Anion gap: 9 (ref 5–15)
BUN: 10 mg/dL (ref 6–20)
CO2: 24 mmol/L (ref 22–32)
Calcium: 9.2 mg/dL (ref 8.9–10.3)
Chloride: 108 mmol/L (ref 98–111)
Creatinine, Ser: 0.52 mg/dL (ref 0.44–1.00)
GFR, Estimated: >60 mL/min (ref >60)
Glucose, Bld: 116 mg/dL — ABNORMAL HIGH (ref 70–99)
Potassium: 3.7 mmol/L (ref 3.5–5.1)
Sodium: 141 mmol/L (ref 135–145)
Total Bilirubin: 0.4 mg/dL (ref 0.3–1.2)
Total Protein: 7.6 g/dL (ref 6.5–8.1)

## 2021-11-03 LAB — GASTROINTESTINAL PANEL BY PCR, STOOL (REPLACES STOOL CULTURE)

## 2021-11-03 LAB — HSV 1/2 PCR, CSF
HSV-1 DNA: NEGATIVE
HSV-2 DNA: NEGATIVE

## 2021-11-03 LAB — CBC
HCT: 34.9 % — ABNORMAL LOW (ref 36.0–46.0)
Hemoglobin: 11.6 g/dL — ABNORMAL LOW (ref 12.0–15.0)
MCH: 27.1 pg (ref 26.0–34.0)
MCHC: 33.2 g/dL (ref 30.0–36.0)
MCV: 81.5 fL (ref 80.0–100.0)
Platelets: 226 10*3/uL (ref 150–400)
RBC: 4.28 MIL/uL (ref 3.87–5.11)
RDW: 13.5 % (ref 11.5–15.5)
WBC: 17 10*3/uL — ABNORMAL HIGH (ref 4.0–10.5)
nRBC: 0 % (ref 0.0–0.2)

## 2021-11-03 LAB — RESPIRATORY PANEL BY PCR

## 2021-11-03 LAB — GLUCOSE, CAPILLARY: Glucose-Capillary: 111 mg/dL — ABNORMAL HIGH (ref 70–99)

## 2021-11-03 LAB — PROCALCITONIN: Procalcitonin: <0.1 ng/mL

## 2021-11-03 NOTE — Care Management (Signed)
?  Transition of Care (TOC) Screening Note ? ? ?Patient Details  ?Name: Krista Farley ?Date of Birth: [birth year] ? ? ?Transition of Care (TOC) CM/SW Contact:    ?Caryn Section, RN ?Phone Number: ?11/03/2021, 4:49 PM ? ? ? ?Transition of Care Department [facility]) has reviewed patient and no TOC needs have been identified at this time. We will continue to monitor patient advancement through interdisciplinary progression rounds. If new patient transition needs arise, please place a TOC consult. ?  ?

## 2021-11-03 NOTE — Discharge Summary (Signed)
Physician Discharge Summary   Krista Farley  female DOB: [birth year]  [id number]  PCP: [facility]  Admit date: 11/02/2021 Discharge date: 11/03/2021  Admitted From: home Disposition:  home CODE STATUS: Full code   Hospital Course:  For full details, please see H&P, progress notes, consult notes and ancillary notes.  Briefly,  Krista Farley is a 31 y.o. female with no significant medical history who presents to ed with complaint of HA, fever/chills /nausea/back pain pain and generalized weakness x 24 hours.   Per husband who gave most of history patient was well am of day PTA. He state that they attended a family gathering and later that evening at home patient became acutely ill. Per husband she was noted to have chills with temp of 103 at home.    Sirs  sepsis ruled out -No fever documented on presentation.  no clear source of infection  -cxr negative, ct abd negative , UA unremarkable  - respiratory panel negative.  RVP neg.  procal within normal limits -LP completed no organism seen, cell count not consistent with bacterial infection. --pt received ceftriaxone and vanc on presentation, which were not continued since extensive workup revealed no source.   --Pt likely had viral illness.   Mild hyponatremia --1 time of 133.  Clinically insignificant   Mild hypokalemia -repleted   Discharge Diagnoses:  Principal Problem:   SIRS (systemic inflammatory response syndrome) (HCC)   30 Day Unplanned Readmission Risk Score    Flowsheet Row ED to Hosp-Admission (Current) from 11/02/2021 in [facility]  30 Day Unplanned Readmission Risk Score (%) 6.58 Filed at 11/03/2021 1200       This score is the patient's risk of an unplanned readmission within 30 days of being discharged (0 -100%). The score is based on dignosis, age, lab data, medications, orders, and past utilization.   Low:  0-14.9    Medium: 15-21.9   High: 22-29.9   Extreme: 30 and above         Discharge Instructions:  Allergies as of 11/03/2021   No Known Allergies      Medication List     STOP taking these medications    acetaminophen 325 MG tablet Commonly known as: Tylenol   bisacodyl 10 MG suppository Commonly known as: DULCOLAX   Derma-Smoothe/FS Scalp 0.01 % Oil Generic drug: Fluocinolone Acetonide Scalp   ibuprofen 600 MG tablet Commonly known as: ADVIL   ketoconazole 2 % shampoo Commonly known as: NIZORAL   meloxicam 15 MG tablet Commonly known as: MOBIC   senna-docusate 8.6-50 MG tablet Commonly known as: Senokot-S   simethicone 80 MG chewable tablet Commonly known as: MYLICON   triamcinolone cream 0.1 % Commonly known as: KENALOG       TAKE these medications    Allegra Allergy 180 MG tablet Generic drug: fexofenadine Take 180 mg by mouth daily.   ISOtretinoin 30 MG capsule Commonly known as: Accutane Take 2 capsules (60 mg total) by mouth daily. Take with food What changed: Another medication with the same name was removed. Continue taking this medication, and follow the directions you see here.   norethindrone-ethinyl estradiol 1-20 MG-MCG tablet Commonly known as: MICROGESTIN Take 1 tablet by mouth daily.         Follow-up Information     [facility] Follow up in 1 week(s).   Contact information: [place] [id number] [phone]  No Known Allergies   The results of significant diagnostics from this hospitalization (including imaging, microbiology, ancillary and laboratory) are listed below for reference.   Consultations:   Procedures/Studies: CT HEAD WO CONTRAST (5MM)  Result Date: 11/02/2021 CLINICAL DATA:  Headache, sudden and severe. EXAM: CT HEAD WITHOUT CONTRAST TECHNIQUE: Contiguous axial images were obtained from the base of the skull through the vertex without intravenous  contrast. RADIATION DOSE REDUCTION: This exam was performed according to the departmental dose-optimization program which includes automated exposure control, adjustment of the mA and/or kV according to patient size and/or use of iterative reconstruction technique. COMPARISON:  None Available. FINDINGS: Brain: No evidence of acute infarction, hemorrhage, hydrocephalus, extra-axial collection or mass lesion/mass effect. Vascular: No hyperdense vessel or unexpected calcification. Skull: Normal. Negative for fracture or focal lesion. Sinuses/Orbits: No acute finding. IMPRESSION: Negative head CT. Electronically Signed   By: Jorje Guild M.D.   On: 11/02/2021 10:57   CT CHEST W CONTRAST  Result Date: 11/02/2021 CLINICAL DATA:  Fever of unknown origin. EXAM: CT CHEST WITH CONTRAST TECHNIQUE: Multidetector CT imaging of the chest was performed during intravenous contrast administration. RADIATION DOSE REDUCTION: This exam was performed according to the departmental dose-optimization program which includes automated exposure control, adjustment of the mA and/or kV according to patient size and/or use of iterative reconstruction technique. CONTRAST:  65mL OMNIPAQUE IOHEXOL 300 MG/ML  SOLN COMPARISON:  None Available. FINDINGS: Cardiovascular: The heart size is normal. No substantial pericardial effusion. No thoracic aortic aneurysm. Mediastinum/Nodes: No mediastinal lymphadenopathy. There is no hilar lymphadenopathy. The esophagus has normal imaging features. There is no axillary lymphadenopathy. Lungs/Pleura: No evidence for pneumothorax or focal airspace consolidation. No pleural effusion. Subtle mosaic attenuation in the lower lobes is nonspecific but may be related to air trapping from small airways disease. 4 mm left lower lobe pulmonary nodule seen on abdomen CT earlier today is again identified on image 80/3. 3 mm lingular nodule visible on 69/3. Upper Abdomen: Unremarkable. Musculoskeletal: No worrisome lytic  or sclerotic osseous abnormality. IMPRESSION: 1. No acute findings in the chest. Specifically, no findings to explain the patient's history of fever. 2. 4 mm left lower lobe pulmonary nodule with 3 mm lingular nodule. No follow-up needed if patient is low-risk (and has no known or suspected primary neoplasm). Non-contrast chest CT can be considered in 12 months if patient is high-risk. This recommendation follows the consensus statement: Guidelines for Management of Incidental Pulmonary Nodules Detected on CT Images: From the Fleischner Society 2017; Radiology 2017; 284:228-243. Electronically Signed   By: Misty Stanley M.D.   On: 11/02/2021 14:38   CT ABDOMEN PELVIS W CONTRAST  Result Date: 11/02/2021 CLINICAL DATA:  Abdominal pain, weakness, headache, fever and chills. EXAM: CT ABDOMEN AND PELVIS WITH CONTRAST TECHNIQUE: Multidetector CT imaging of the abdomen and pelvis was performed using the standard protocol following bolus administration of intravenous contrast. RADIATION DOSE REDUCTION: This exam was performed according to the departmental dose-optimization program which includes automated exposure control, adjustment of the mA and/or kV according to patient size and/or use of iterative reconstruction technique. CONTRAST:  123mL OMNIPAQUE IOHEXOL 300 MG/ML  SOLN COMPARISON:  None Available. FINDINGS: Lower chest: Small subpleural nodule noted in the anterolateral left lower lobe measuring 4 mm, image 22/4. No acute abnormality identified. Hepatobiliary: No focal liver abnormality is seen. No gallstones, gallbladder wall thickening, or biliary dilatation. Pancreas: Unremarkable. No pancreatic ductal dilatation or surrounding inflammatory changes. Spleen: Normal in size without focal abnormality. Adrenals/Urinary Tract: Adrenal glands are  unremarkable. Kidneys are normal, without renal calculi, focal lesion, or hydronephrosis. Bladder is unremarkable. Stomach/Bowel: The stomach appears normal. The appendix  is visualized and is within normal limits. No bowel wall thickening, inflammation, or distension. Vascular/Lymphatic: No significant vascular findings are present. No enlarged abdominal or pelvic lymph nodes. Reproductive: Normal appearance of the uterus. Simple appearing left ovary cyst measures 2.7 cm, image 65/2. Normal appearance of the right ovary. Other: There is no free fluid or fluid collection identified within the abdomen or pelvis. Musculoskeletal: No acute or significant osseous findings. IMPRESSION: 1. No acute findings within the abdomen or pelvis. 2. Simple appearing left ovary cyst measures 3.2 cm. No follow-up imaging is recommended. 3. Left lower lobe lung nodule identified. This measures 4 mm. No routine follow-up recommended. Electronically Signed   By: Kerby Moors M.D.   On: 11/02/2021 11:02   DG Chest Portable 1 View  Result Date: 11/02/2021 CLINICAL DATA:  Weakness, headache, fever and chills.  Back pain. EXAM: PORTABLE CHEST 1 VIEW COMPARISON:  09/26/2012. FINDINGS: Normal heart, mediastinum and hila. Clear lungs.  No pleural effusion or pneumothorax. Skeletal structures grossly intact. IMPRESSION: No active disease. Electronically Signed   By: Lajean Manes M.D.   On: 11/02/2021 10:31      Labs: BNP (last 3 results) No results for input(s): BNP in the last 8760 hours. Basic Metabolic Panel: Recent Labs  Lab 11/02/21 0945 11/02/21 1647 11/03/21 0414  NA 133* 139 141  K 3.4* 3.8 3.7  CL 103 111 108  CO2 23 22 24   GLUCOSE 109* 151* 116*  BUN 14 9 10   CREATININE 0.80 0.61 0.52  CALCIUM 8.6* 9.0 9.2  MG  --  2.4  --    Liver Function Tests: Recent Labs  Lab 11/02/21 0945 11/03/21 0414  AST 16 14*  ALT 13 13  ALKPHOS 86 78  BILITOT 0.6 0.4  PROT 7.9 7.6  ALBUMIN 3.8 3.6   No results for input(s): LIPASE, AMYLASE in the last 168 hours. No results for input(s): AMMONIA in the last 168 hours. CBC: Recent Labs  Lab 11/02/21 0945 11/03/21 0414  WBC 16.2*  17.0*  NEUTROABS 13.5*  --   HGB 12.2 11.6*  HCT 37.1 34.9*  MCV 81.2 81.5  PLT 199 226   Cardiac Enzymes: No results for input(s): CKTOTAL, CKMB, CKMBINDEX, TROPONINI in the last 168 hours. BNP: Invalid input(s): POCBNP CBG: Recent Labs  Lab 11/03/21 0734  GLUCAP 111*   D-Dimer No results for input(s): DDIMER in the last 72 hours. Hgb A1c Recent Labs    11/02/21 1413  HGBA1C 5.2   Lipid Profile No results for input(s): CHOL, HDL, LDLCALC, TRIG, CHOLHDL, LDLDIRECT in the last 72 hours. Thyroid function studies Recent Labs    11/02/21 1413  TSH 0.515   Anemia work up No results for input(s): VITAMINB12, FOLATE, FERRITIN, TIBC, IRON, RETICCTPCT in the last 72 hours. Urinalysis    Component Value Date/Time   COLORURINE YELLOW (A) 11/02/2021 0945   APPEARANCEUR HAZY (A) 11/02/2021 0945   LABSPEC 1.023 11/02/2021 0945   PHURINE 6.0 11/02/2021 0945   GLUCOSEU NEGATIVE 11/02/2021 0945   HGBUR NEGATIVE 11/02/2021 0945   BILIRUBINUR NEGATIVE 11/02/2021 0945   KETONESUR NEGATIVE 11/02/2021 0945   PROTEINUR NEGATIVE 11/02/2021 0945   NITRITE NEGATIVE 11/02/2021 0945   LEUKOCYTESUR NEGATIVE 11/02/2021 0945   Sepsis Labs Invalid input(s): PROCALCITONIN,  WBC,  LACTICIDVEN Microbiology Recent Results (from the past 240 hour(s))  Resp Panel by RT-PCR (Flu A&B,  Covid) Nasopharyngeal Swab     Status: None   Collection Time: 11/02/21  9:45 AM   Specimen: Nasopharyngeal Swab; Nasopharyngeal(NP) swabs in vial transport medium  Result Value Ref Range Status   SARS Coronavirus 2 by RT PCR NEGATIVE NEGATIVE Final    Comment: (NOTE) SARS-CoV-2 target nucleic acids are NOT DETECTED.  The SARS-CoV-2 RNA is generally detectable in upper respiratory specimens during the acute phase of infection. The lowest concentration of SARS-CoV-2 viral copies this assay can detect is 138 copies/mL. A negative result does not preclude SARS-Cov-2 infection and should not be used as the sole  basis for treatment or other patient management decisions. A negative result may occur with  improper specimen collection/handling, submission of specimen other than nasopharyngeal swab, presence of viral mutation(s) within the areas targeted by this assay, and inadequate number of viral copies(<138 copies/mL). A negative result must be combined with clinical observations, patient history, and epidemiological information. The expected result is Negative.  Fact Sheet for Patients:  EntrepreneurPulse.com.au  Fact Sheet for Healthcare Providers:  IncredibleEmployment.be  This test is no t yet approved or cleared by the [place] FDA and  has been authorized for detection and/or diagnosis of SARS-CoV-2 by FDA under an Emergency Use Authorization (EUA). This EUA will remain  in effect (meaning this test can be used) for the duration of the COVID-19 declaration under Section 564(b)(1) of the Act, 21 U.S.C.section 360bbb-3(b)(1), unless the authorization is terminated  or revoked sooner.       Influenza A by PCR NEGATIVE NEGATIVE Final   Influenza B by PCR NEGATIVE NEGATIVE Final    Comment: (NOTE) The Xpert Xpress SARS-CoV-2/FLU/RSV plus assay is intended as an aid in the diagnosis of influenza from Nasopharyngeal swab specimens and should not be used as a sole basis for treatment. Nasal washings and aspirates are unacceptable for Xpert Xpress SARS-CoV-2/FLU/RSV testing.  Fact Sheet for Patients: EntrepreneurPulse.com.au  Fact Sheet for Healthcare Providers: IncredibleEmployment.be  This test is not yet approved or cleared by the [place] FDA and has been authorized for detection and/or diagnosis of SARS-CoV-2 by FDA under an Emergency Use Authorization (EUA). This EUA will remain in effect (meaning this test can be used) for the duration of the COVID-19 declaration under Section 564(b)(1) of the Act,  21 U.S.C. section 360bbb-3(b)(1), unless the authorization is terminated or revoked.  Performed at [facility], [address] [place], [place] [address]   Blood Culture (routine x 2)     Status: None (Preliminary result)   Collection Time: 11/02/21  9:45 AM   Specimen: BLOOD  Result Value Ref Range Status   Specimen Description BLOOD RIGHT ANTECUBITAL  Final   Special Requests   Final    BOTTLES DRAWN AEROBIC AND ANAEROBIC Blood Culture results may not be optimal due to an excessive volume of blood received in culture bottles   Culture   Final    NO GROWTH < 24 HOURS Performed at [facility], [address], [place], [place] [address]    Report Status PENDING  Incomplete  Blood Culture (routine x 2)     Status: None (Preliminary result)   Collection Time: 11/02/21  9:45 AM   Specimen: BLOOD  Result Value Ref Range Status   Specimen Description BLOOD LEFT ANTECUBITAL  Final   Special Requests   Final    BOTTLES DRAWN AEROBIC AND ANAEROBIC Blood Culture adequate volume   Culture   Final    NO GROWTH < 24  HOURS Performed at [facility], [address] [place], [place] [address]    Report Status PENDING  Incomplete  CSF culture w Gram Stain     Status: None (Preliminary result)   Collection Time: 11/02/21 10:58 AM   Specimen: CSF; Cerebrospinal Fluid  Result Value Ref Range Status   Specimen Description   Final    CSF Performed at [facility], [address] [place], [place] [address]    Special Requests   Final    NONE Performed at [facility], [address] [place], [place] [address]    Gram Stain   Final    NO ORGANISMS SEEN WBC SEEN RBC SEEN Performed at [facility], [address], [place], [place] [address]    Culture   Final    NO GROWTH < 24 HOURS Performed at [facility], [place] [address] [place], [place] [address]    Report Status PENDING  Incomplete  Respiratory (~20 pathogens)  panel by PCR     Status: None   Collection Time: 11/03/21  9:15 AM   Specimen: Nasopharyngeal Swab; Respiratory  Result Value Ref Range Status   Adenovirus NOT DETECTED NOT DETECTED Final   Coronavirus 229E NOT DETECTED NOT DETECTED Final    Comment: (NOTE) The Coronavirus on the Respiratory Panel, DOES NOT test for the novel  Coronavirus (2019 nCoV)    Coronavirus HKU1 NOT DETECTED NOT DETECTED Final   Coronavirus NL63 NOT DETECTED NOT DETECTED Final   Coronavirus OC43 NOT DETECTED NOT DETECTED Final   Metapneumovirus NOT DETECTED NOT DETECTED Final   Rhinovirus / Enterovirus NOT DETECTED NOT DETECTED Final   Influenza A NOT DETECTED NOT DETECTED Final   Influenza B NOT DETECTED NOT DETECTED Final   Parainfluenza Virus 1 NOT DETECTED NOT DETECTED Final   Parainfluenza Virus 2 NOT DETECTED NOT DETECTED Final   Parainfluenza Virus 3 NOT DETECTED NOT DETECTED Final   Parainfluenza Virus 4 NOT DETECTED NOT DETECTED Final   Respiratory Syncytial Virus NOT DETECTED NOT DETECTED Final   Bordetella pertussis NOT DETECTED NOT DETECTED Final   Bordetella Parapertussis NOT DETECTED NOT DETECTED Final   Chlamydophila pneumoniae NOT DETECTED NOT DETECTED Final   Mycoplasma pneumoniae NOT DETECTED NOT DETECTED Final    Comment: Performed at [facility], [place]. [address] [place], [place] [address]  Gastrointestinal Panel by PCR , Stool     Status: None   Collection Time: 11/03/21  9:31 AM   Specimen: Stool  Result Value Ref Range Status   Campylobacter species NOT DETECTED NOT DETECTED Final   Plesimonas shigelloides NOT DETECTED NOT DETECTED Final   Salmonella species NOT DETECTED NOT DETECTED Final   Yersinia enterocolitica NOT DETECTED NOT DETECTED Final   Vibrio species NOT DETECTED NOT DETECTED Final   Vibrio cholerae NOT DETECTED NOT DETECTED Final   Enteroaggregative E coli (EAEC) NOT DETECTED NOT DETECTED Final   Enteropathogenic E coli (EPEC) NOT DETECTED NOT DETECTED Final    Enterotoxigenic E coli (ETEC) NOT DETECTED NOT DETECTED Final   Shiga like toxin producing E coli (STEC) NOT DETECTED NOT DETECTED Final   Shigella/Enteroinvasive E coli (EIEC) NOT DETECTED NOT DETECTED Final   Cryptosporidium NOT DETECTED NOT DETECTED Final   Cyclospora cayetanensis NOT DETECTED NOT DETECTED Final   Entamoeba histolytica NOT DETECTED NOT DETECTED Final   Giardia lamblia NOT DETECTED NOT DETECTED Final   Adenovirus F40/41 NOT DETECTED NOT DETECTED Final   Astrovirus NOT DETECTED NOT  DETECTED Final   Norovirus GI/GII NOT DETECTED NOT DETECTED Final   Rotavirus A NOT DETECTED NOT DETECTED Final   Sapovirus (I, II, IV, and V) NOT DETECTED NOT DETECTED Final    Comment: Performed at [facility], [address] [place], [place] [address]     Total time spend on discharging this patient, including the last patient exam, discussing the hospital stay, instructions for ongoing care as it relates to all pertinent caregivers, as well as preparing the medical discharge records, prescriptions, and/or referrals as applicable, is 45 minutes.    Enzo Bi, MD  [facility] 11/03/2021, 3:56 PM

## 2021-11-05 ENCOUNTER — Other Ambulatory Visit: Payer: Self-pay

## 2021-11-05 ENCOUNTER — Emergency Department
Admission: EM | Admit: 2021-11-05 | Discharge: 2021-11-05 | Disposition: A | Discharge status: Home / Self Care | Payer: Medicaid Other | Attending: Emergency Medicine | Admitting: Emergency Medicine

## 2021-11-05 DIAGNOSIS — R519 Headache, unspecified: Secondary | ICD-10-CM | POA: Insufficient documentation

## 2021-11-05 DIAGNOSIS — D72829 Elevated white blood cell count, unspecified: Secondary | ICD-10-CM | POA: Insufficient documentation

## 2021-11-05 DIAGNOSIS — N3000 Acute cystitis without hematuria: Secondary | ICD-10-CM | POA: Insufficient documentation

## 2021-11-05 DIAGNOSIS — R112 Nausea with vomiting, unspecified: Secondary | ICD-10-CM | POA: Insufficient documentation

## 2021-11-05 DIAGNOSIS — M542 Cervicalgia: Secondary | ICD-10-CM | POA: Insufficient documentation

## 2021-11-05 DIAGNOSIS — R197 Diarrhea, unspecified: Secondary | ICD-10-CM | POA: Insufficient documentation

## 2021-11-05 LAB — COMPREHENSIVE METABOLIC PANEL
ALT: 17 U/L (ref 0–44)
AST: 17 U/L (ref 15–41)
Albumin: 4 g/dL (ref 3.5–5.0)
Alkaline Phosphatase: 80 U/L (ref 38–126)
Anion gap: 12 (ref 5–15)
BUN: 14 mg/dL (ref 6–20)
CO2: 23 mmol/L (ref 22–32)
Calcium: 9.2 mg/dL (ref 8.9–10.3)
Chloride: 102 mmol/L (ref 98–111)
Creatinine, Ser: 0.72 mg/dL (ref 0.44–1.00)
GFR, Estimated: >60 mL/min (ref >60)
Glucose, Bld: 114 mg/dL — ABNORMAL HIGH (ref 70–99)
Potassium: 3.2 mmol/L — ABNORMAL LOW (ref 3.5–5.1)
Sodium: 137 mmol/L (ref 135–145)
Total Bilirubin: 0.1 mg/dL — ABNORMAL LOW (ref 0.3–1.2)
Total Protein: 8.6 g/dL — ABNORMAL HIGH (ref 6.5–8.1)

## 2021-11-05 LAB — URINALYSIS, ROUTINE W REFLEX MICROSCOPIC
Bilirubin Urine: NEGATIVE
Glucose, UA: NEGATIVE mg/dL
Hgb urine dipstick: NEGATIVE
Ketones, ur: 5 mg/dL — AB
Leukocytes,Ua: NEGATIVE
Nitrite: NEGATIVE
Protein, ur: 30 mg/dL — AB
Specific Gravity, Urine: 1.023 (ref 1.005–1.030)
pH: 7 (ref 5.0–8.0)

## 2021-11-05 LAB — CBC
HCT: 39.2 % (ref 36.0–46.0)
Hemoglobin: 13.2 g/dL (ref 12.0–15.0)
MCH: 26.9 pg (ref 26.0–34.0)
MCHC: 33.7 g/dL (ref 30.0–36.0)
MCV: 80 fL (ref 80.0–100.0)
Platelets: 316 10*3/uL (ref 150–400)
RBC: 4.9 MIL/uL (ref 3.87–5.11)
RDW: 13.3 % (ref 11.5–15.5)
WBC: 13.5 10*3/uL — ABNORMAL HIGH (ref 4.0–10.5)
nRBC: 0 % (ref 0.0–0.2)

## 2021-11-05 LAB — CSF CULTURE W GRAM STAIN
Culture: NO GROWTH
Gram Stain: NONE SEEN

## 2021-11-05 LAB — WET PREP, GENITAL
Clue Cells Wet Prep HPF POC: NONE SEEN
Sperm: NONE SEEN
Trich, Wet Prep: NONE SEEN
WBC, Wet Prep HPF POC: <10 (ref <10)
Yeast Wet Prep HPF POC: NONE SEEN

## 2021-11-05 LAB — LIPASE, BLOOD: Lipase: 23 U/L (ref 11–51)

## 2021-11-05 LAB — POC URINE PREG, ED: Preg Test, Ur: NEGATIVE

## 2021-11-05 MED ORDER — METOCLOPRAMIDE HCL 5 MG/ML IJ SOLN
10.0000 mg | Freq: Once | INTRAMUSCULAR | Status: AC
  Administered 2021-11-05: 10 mg via INTRAVENOUS
  Filled 2021-11-05: qty 2

## 2021-11-05 MED ORDER — ONDANSETRON 4 MG PO TBDP
4.0000 mg | ORAL_TABLET | Freq: Three times a day (TID) | ORAL | 0 refills | Status: AC | PRN
Start: 2021-11-05 — End: ?

## 2021-11-05 MED ORDER — KETOROLAC TROMETHAMINE 30 MG/ML IJ SOLN
30.0000 mg | Freq: Once | INTRAMUSCULAR | Status: AC
  Administered 2021-11-05: 30 mg via INTRAVENOUS
  Filled 2021-11-05: qty 1

## 2021-11-05 MED ORDER — SODIUM CHLORIDE 0.9 % IV BOLUS
1000.0000 mL | Freq: Once | INTRAVENOUS | Status: AC
  Administered 2021-11-05: 1000 mL via INTRAVENOUS

## 2021-11-05 MED ORDER — CEPHALEXIN 500 MG PO CAPS: 500.0000 mg | ORAL_CAPSULE | Freq: Three times a day (TID) | ORAL | 0 refills | Status: AC

## 2021-11-05 MED ORDER — CYCLOBENZAPRINE HCL 5 MG PO TABS: 5.0000 mg | ORAL_TABLET | Freq: Three times a day (TID) | ORAL | 0 refills | Status: AC | PRN

## 2021-11-05 MED ORDER — SODIUM CHLORIDE 0.9 % IV SOLN
1.0000 g | Freq: Once | INTRAVENOUS | Status: AC
  Administered 2021-11-05: 1 g via INTRAVENOUS
  Filled 2021-11-05: qty 10

## 2021-11-05 MED ORDER — ORPHENADRINE CITRATE 30 MG/ML IJ SOLN
60.0000 mg | INTRAMUSCULAR | Status: AC
  Administered 2021-11-05: 60 mg via INTRAVENOUS
  Filled 2021-11-05: qty 2

## 2021-11-05 NOTE — ED Provider Notes (Signed)
[facility] Emergency Department Provider Note     Event Date/Time   First MD Initiated Contact with Patient 11/05/21 1308     (approximate)   History   Emesis   HPI  History limited by Spanish language.  Interpreter present for interview and exam.  Krista Farley is a 31 y.o. female with a non-contributory medical history presents to the ED for evaluation of continued nausea and vomiting.  Patient also reports intermittent neck pain.  She was evaluated and admitted last week for sepsis after complete work-up.  She returns today noting continued neck pain as well as frontal headache.  Patient was also evaluated yesterday by Ortho spine, based on chart review, and has a pending referral to neurology for her headache syndrome.  She presents today noting some light sensitivity, nausea, vomiting, and intermittent diarrhea.  Patient was started on a Medrol Dosepak by the Ortho provider yesterday.  She denies any medicine at home for nausea and denies any current treatment for her headache syndrome.  Does not endorse any urinary frequency as well as some scant vaginal discharge.     Physical Exam   Triage Vital Signs: ED Triage Vitals  Enc Vitals Group     BP 11/05/21 1126 (!) 134/92     Pulse Rate 11/05/21 1126 95     Resp 11/05/21 1126 20     Temp 11/05/21 1126 98.3 F (36.8 C)     Temp Source 11/05/21 1126 Oral     SpO2 11/05/21 1126 100 %     Weight 11/05/21 1126 175 lb (79.4 kg)     Height 11/05/21 1126 5\' 4"  (1.626 m)     Head Circumference --      Peak Flow --      Pain Score 11/05/21 1137 10     Pain Loc --      Pain Edu? --      Excl. in GC? --     Most recent vital signs: Vitals:   11/05/21 1500 11/05/21 1613  BP: 116/73 118/76  Pulse: 65 68  Resp: 18 16  Temp:    SpO2: 99% 99%    General Awake, no distress.  HEENT NCAT. PERRL. EOMI. normal fundi bilaterally.  No rhinorrhea. Mucous membranes are moist.  Neck is supple  without nuchal rigidity appreciated. CV:  Good peripheral perfusion. RRR RESP:  Normal effort.  ABD:  No distention. Soft, nontender. Normal bowel sounds.  Formed stools noted. NEURO: Cranial nerves II to XII grossly intact.   ED Results / Procedures / Treatments   Labs (all labs ordered are listed, but only abnormal results are displayed) Labs Reviewed  COMPREHENSIVE METABOLIC PANEL - Abnormal; Notable for the following components:      Result Value   Potassium 3.2 (*)    Glucose, Bld 114 (*)    Total Protein 8.6 (*)    Total Bilirubin 0.1 (*)    All other components within normal limits  CBC - Abnormal; Notable for the following components:   WBC 13.5 (*)    All other components within normal limits  URINALYSIS, ROUTINE W REFLEX MICROSCOPIC - Abnormal; Notable for the following components:   Color, Urine YELLOW (*)    APPearance CLOUDY (*)    Ketones, ur 5 (*)    Protein, ur 30 (*)    Bacteria, UA MANY (*)    All other components within normal limits  WET PREP, GENITAL  URINE CULTURE  LIPASE,  BLOOD  POC URINE PREG, ED     EKG   RADIOLOGY    No results found.   PROCEDURES:  Critical Care performed: No  Procedures   MEDICATIONS ORDERED IN ED: Medications  sodium chloride 0.9 % bolus 1,000 mL (0 mLs Intravenous Stopped 11/05/21 1500)  metoCLOPramide (REGLAN) injection 10 mg (10 mg Intravenous Given 11/05/21 1345)  orphenadrine (NORFLEX) injection 60 mg (60 mg Intravenous Given 11/05/21 1345)  cefTRIAXone (ROCEPHIN) 1 g in sodium chloride 0.9 % 100 mL IVPB (0 g Intravenous Stopped 11/05/21 1612)  ketorolac (TORADOL) 30 MG/ML injection 30 mg (30 mg Intravenous Given 11/05/21 1649)     IMPRESSION / MDM / ASSESSMENT AND PLAN / ED COURSE  I reviewed the triage vital signs and the nursing notes.                              Differential diagnosis includes, but is not limited to, ovarian cyst, ovarian torsion, acute appendicitis, diverticulitis, urinary tract  infection/pyelonephritis, endometriosis, bowel obstruction, colitis, renal colic, gastroenteritis, hernia, fibroids, endometriosis, pregnancy related pain including ectopic pregnancy, etc.   Patient to the ED for evaluation of intermittent headache as well as continued neck pain following recent mission to the hospital for sepsis work-up which included an LP.  Patient presents without interval complaints of fevers.  She is currently on a course of steroids since being evaluated by Ortho provider yesterday.  She presents to the ED for management of her ongoing headache syndrome with associated nausea and vomiting.  Labs are reassuring with the exception of a nonspecific leukocytosis which is improved from her last recorded WBC few days earlier.  Given current steroid Dosepak, this may be related to that.  UA reveals moderate bacteriuria.  Urine culture is pending at this time.  The patient was treated with an empiric dose of Rocephin in the ED as well as a headache cocktail including Toradol, Norflex, Zofran, and a normal saline bolus.  In patient's exam is overall reassuring at this time.  No signs of any acute neurovascular deficit, radiculopathy, or meningeal signs.  Patient's diagnosis is consistent with headache and UTI. Patient will be discharged home with prescriptions for Keflex, cyclobenzaprine, and Zofran. Patient is to follow up with her primary provider and neurology as referred as needed or otherwise directed. Patient is given ED precautions to return to the ED for any worsening or new symptoms.   FINAL CLINICAL IMPRESSION(S) / ED DIAGNOSES   Final diagnoses:  Acute cystitis without hematuria  Acute nonintractable headache, unspecified headache type     Rx / DC Orders   ED Discharge Orders          Ordered    cephALEXin (KEFLEX) 500 MG capsule  3 times daily        11/05/21 1630    ondansetron (ZOFRAN-ODT) 4 MG disintegrating tablet  Every 8 hours PRN        11/05/21 1630     cyclobenzaprine (FLEXERIL) 5 MG tablet  3 times daily PRN        11/05/21 1630             Note:  This document was prepared using Dragon voice recognition software and may include unintentional dictation errors.    Melvenia Needles, PA-C 11/05/21 1950    Vanessa , MD 11/06/21 1115

## 2021-11-05 NOTE — ED Notes (Signed)
Pt given cup of water with verbal ok from provider JM.  ?

## 2021-11-05 NOTE — ED Notes (Signed)
See triage note  presents with family with n/v/d and neck pain  per family she was recently discharged from hospital for possible sepsis  afebrile on arrival  per family her sx's started last pm ?

## 2021-11-05 NOTE — Discharge Instructions (Addendum)
Your exam and labs are normal.  You are being treated for UTI.  Take the antibiotic as directed.  Take the nausea medicine and muscle relaxant as needed.  Follow-up with your primary provider as discussed.  You will be referred to neurology by her PCP for ongoing evaluation.  Return to the ED if needed. ? ?Su examen y laboratorios son normales. Usted est? siendo tratado por UTI. Tome el antibi?tico seg?n las indicaciones. Tome el medicamento para las n?useas y el relajante muscular seg?n sea necesario. Haga un seguimiento con su proveedor principal seg?n lo discutido. Su PCP lo derivar? a neurolog?a para una evaluaci?n continua. Regrese al servicio de urgencias si es necesario. ?

## 2021-11-05 NOTE — ED Triage Notes (Signed)
Pt comes pov with n/v/ and neck pain. Pt was here recently and entire workup-including LP was completed. Isn't getting better.  ?

## 2021-11-06 LAB — URINE CULTURE
Culture: NO GROWTH
Special Requests: NORMAL

## 2021-11-07 LAB — CULTURE, BLOOD (ROUTINE X 2)
Culture: NO GROWTH
Culture: NO GROWTH
Special Requests: ADEQUATE

## 2021-11-10 ENCOUNTER — Encounter: Payer: Self-pay | Admitting: Dermatology

## 2021-11-26 ENCOUNTER — Ambulatory Visit: Payer: Medicaid Other | Admitting: Dermatology

## 2022-12-12 ENCOUNTER — Emergency Department
Admission: EM | Admit: 2022-12-12 | Discharge: 2022-12-13 | Disposition: A | Discharge status: Home / Self Care | Payer: Medicaid Other | Attending: Emergency Medicine | Admitting: Emergency Medicine

## 2022-12-12 ENCOUNTER — Other Ambulatory Visit: Payer: Self-pay

## 2022-12-12 DIAGNOSIS — T782XXA Anaphylactic shock, unspecified, initial encounter: Secondary | ICD-10-CM | POA: Diagnosis not present

## 2022-12-12 DIAGNOSIS — T7840XA Allergy, unspecified, initial encounter: Secondary | ICD-10-CM | POA: Diagnosis not present

## 2022-12-12 DIAGNOSIS — L509 Urticaria, unspecified: Secondary | ICD-10-CM | POA: Diagnosis present

## 2022-12-12 MED ORDER — METHYLPREDNISOLONE SODIUM SUCC 125 MG IJ SOLR
125.0000 mg | Freq: Once | INTRAMUSCULAR | Status: AC
  Administered 2022-12-12: 125 mg via INTRAVENOUS
  Filled 2022-12-12: qty 2

## 2022-12-12 MED ORDER — FAMOTIDINE IN NACL 20-0.9 MG/50ML-% IV SOLN
20.0000 mg | Freq: Once | INTRAVENOUS | Status: AC
  Administered 2022-12-12: 20 mg via INTRAVENOUS
  Filled 2022-12-12: qty 50

## 2022-12-12 MED ORDER — EPINEPHRINE 0.3 MG/0.3ML IJ SOAJ
0.3000 mg | Freq: Once | INTRAMUSCULAR | Status: AC
  Administered 2022-12-12: 0.3 mg via INTRAMUSCULAR
  Filled 2022-12-12: qty 0.3

## 2022-12-12 MED ORDER — LORAZEPAM 1 MG PO TABS
1.0000 mg | ORAL_TABLET | Freq: Once | ORAL | Status: AC
  Administered 2022-12-12: 1 mg via ORAL
  Filled 2022-12-12: qty 1

## 2022-12-12 MED ORDER — DIPHENHYDRAMINE HCL 50 MG/ML IJ SOLN
50.0000 mg | Freq: Once | INTRAMUSCULAR | Status: AC
  Administered 2022-12-12: 50 mg via INTRAVENOUS
  Filled 2022-12-12: qty 1

## 2022-12-12 MED ORDER — SODIUM CHLORIDE 0.9 % IV BOLUS
1000.0000 mL | Freq: Once | INTRAVENOUS | Status: AC
  Administered 2022-12-12: 1000 mL via INTRAVENOUS

## 2022-12-12 NOTE — ED Provider Notes (Incomplete)
[facility] Provider Note    Event Date/Time   First MD Initiated Contact with Patient 12/12/22 2303     (approximate)   History   Allergic Reaction   HPI  Krista Farley is a 32 y.o. female   Past medical history of ***    Independent Historian contributed to assessment above: ***  External Medical Documents Reviewed: ***      Physical Exam   Triage Vital Signs: ED Triage Vitals  Enc Vitals Group     BP 12/12/22 2253 127/78     Pulse Rate 12/12/22 2253 (!) 109     Resp 12/12/22 2253 16     Temp 12/12/22 2253 98 F (36.7 C)     Temp Source 12/12/22 2253 Oral     SpO2 12/12/22 2253 98 %     Weight 12/12/22 2254 176 lb 5.9 oz (80 kg)     Height 12/12/22 2254 5\' 4"  (1.626 m)     Head Circumference --      Peak Flow --      Pain Score 12/12/22 2254 0     Pain Loc --      Pain Edu? --      Excl. in GC? --     Most recent vital signs: Vitals:   12/12/22 2253  BP: 127/78  Pulse: (!) 109  Resp: 16  Temp: 98 F (36.7 C)  SpO2: 98%    General: Awake, no distress. *** CV:  Good peripheral perfusion. *** Resp:  Normal effort. *** Abd:  No distention. *** Other:  ***   ED Results / Procedures / Treatments   Labs (all labs ordered are listed, but only abnormal results are displayed) Labs Reviewed - No data to display   I ordered and reviewed the above labs they are notable for ***  EKG  ED ECG REPORT I, Pilar Jarvis, the attending physician, personally viewed and interpreted this ECG.   Date: 12/12/2022  EKG Time: ***  Rate: ***  Rhythm: {ekg findings:315101}  Axis: ***  Intervals:{conduction defects:17367}  ST&T Change: ***    RADIOLOGY I independently reviewed and interpreted ***   PROCEDURES:  Critical Care performed: {CriticalCareYesNo:19197::"Yes, see critical care procedure note(s)","No"}  Procedures   MEDICATIONS ORDERED IN ED: Medications  sodium chloride 0.9 % bolus 1,000 mL (has no  administration in time range)  EPINEPHrine (EPI-PEN) injection 0.3 mg (has no administration in time range)  methylPREDNISolone sodium succinate (SOLU-MEDROL) 125 mg/2 mL injection 125 mg (has no administration in time range)  famotidine (PEPCID) IVPB 20 mg premix (has no administration in time range)  diphenhydrAMINE (BENADRYL) injection 50 mg (has no administration in time range)    External physician / consultants:  I spoke with *** regarding care plan for this patient.   IMPRESSION / MDM / ASSESSMENT AND PLAN / ED COURSE  I reviewed the triage vital signs and the nursing notes.                                Patient's presentation is most consistent with {EM COPA:27473}  Differential diagnosis includes, but is not limited to, ***   ***The patient is on the cardiac monitor to evaluate for evidence of arrhythmia and/or significant heart rate changes.  MDM:  ***  I considered hospitalization for admission or observation ***        FINAL CLINICAL IMPRESSION(S) / ED DIAGNOSES  Final diagnoses:  None     Rx / DC Orders   ED Discharge Orders     None        Note:  This document was prepared using Dragon voice recognition software and may include unintentional dictation errors.

## 2022-12-12 NOTE — ED Triage Notes (Addendum)
Pt to ed from home via POV for allergic reaction. Went to eat dinner at [employer] for dinner. Pt has rash and hives all over her body. Pt  advised she feels SOB and is burning in her face.. Pt is speaking in complete sentences and in no acute distress in triage. Pt moved straight to room for treatment.

## 2022-12-12 NOTE — ED Provider Notes (Addendum)
[facility] Provider Note    Event Date/Time   First MD Initiated Contact with Patient 12/12/22 2303     (approximate)   History   Allergic Reaction   HPI  Krista Farley is a 32 y.o. female   Past medical history of anxiety who presents emergency department with potential allergic reaction.  She was out at [employer] eating a meal that had vegetables, cheese, beef, pork, chicken, shrimp and has no known past allergies but then shortly thereafter developed hives itching to the whole body as well as a tingling numbness sensation in her mouth and back of throat.  No shortness of breath.  No nausea vomiting diarrhea or abdominal cramping pain.  No other known exposures.  Independent Historian contributed to assessment above: Husband at bedside to corroborate information given above and past medical history       Physical Exam   Triage Vital Signs: ED Triage Vitals  Enc Vitals Group     BP 12/12/22 2253 127/78     Pulse Rate 12/12/22 2253 (!) 109     Resp 12/12/22 2253 16     Temp 12/12/22 2253 98 F (36.7 C)     Temp Source 12/12/22 2253 Oral     SpO2 12/12/22 2253 98 %     Weight 12/12/22 2254 176 lb 5.9 oz (80 kg)     Height 12/12/22 2254 5\' 4"  (1.626 m)     Head Circumference --      Peak Flow --      Pain Score 12/12/22 2254 0     Pain Loc --      Pain Edu? --      Excl. in GC? --     Most recent vital signs: Vitals:   12/12/22 2357 12/13/22 0006  BP:  (!) 106/59  Pulse: 95 88  Resp: 17 16  Temp:    SpO2:  98%    General: Awake, no distress.  CV:  Good peripheral perfusion.  Resp:  Normal effort.  Abd:  No distention.  Other:  Anxious appearing, hives to the neck, face, torso and bilateral upper extremities noted.  Oropharynx appears normal without swelling, lungs clear without wheezing, abdomen soft nontender.   ED Results / Procedures / Treatments   Labs (all labs ordered are listed, but only abnormal results  are displayed) Labs Reviewed - No data to display     PROCEDURES:  Critical Care performed: Yes, see critical care procedure note(s)  .Critical Care  Performed by: Pilar Jarvis, MD Authorized by: Pilar Jarvis, MD   Critical care provider statement:    Critical care time (minutes):  30   Critical care was time spent personally by me on the following activities:  Development of treatment plan with patient or surrogate, discussions with consultants, evaluation of patient's response to treatment, examination of patient, ordering and review of laboratory studies, ordering and review of radiographic studies, ordering and performing treatments and interventions, pulse oximetry, re-evaluation of patient's condition and review of old charts    MEDICATIONS ORDERED IN ED: Medications  sodium chloride 0.9 % bolus 1,000 mL (1,000 mLs Intravenous New Bag/Given 12/12/22 2308)  EPINEPHrine (EPI-PEN) injection 0.3 mg (0.3 mg Intramuscular Given 12/12/22 2313)  methylPREDNISolone sodium succinate (SOLU-MEDROL) 125 mg/2 mL injection 125 mg (125 mg Intravenous Given 12/12/22 2309)  famotidine (PEPCID) IVPB 20 mg premix (0 mg Intravenous Stopped 12/12/22 2339)  diphenhydrAMINE (BENADRYL) injection 50 mg (50 mg Intravenous Given 12/12/22 2309)  LORazepam (ATIVAN) tablet 1 mg (1 mg Oral Given 12/12/22 2348)     IMPRESSION / MDM / ASSESSMENT AND PLAN / ED COURSE  I reviewed the triage vital signs and the nursing notes.                                Patient's presentation is most consistent with acute presentation with potential threat to life or bodily function.  Differential diagnosis includes, but is not limited to, allergic reaction and anaphylaxis   The patient is on the cardiac monitor to evaluate for evidence of arrhythmia and/or significant heart rate changes.  MDM:    Concern for anaphylaxis given hives, itchy skin rash, oropharyngeal involvement, no signs of airway obstruction or respiratory  distress at this time -proceeded with EpiPen, steroids, antihistamines, IV crystalloid bolus and will observe after administration.   -- Reassessed and resting comfortably complete resolution of symptoms.  Will await further direction of observation.  In the emergency department and continues to be stable will be discharged.      FINAL CLINICAL IMPRESSION(S) / ED DIAGNOSES   Final diagnoses:  Anaphylaxis, initial encounter  Allergic reaction, initial encounter  Hives     Rx / DC Orders   ED Discharge Orders          Ordered    predniSONE (DELTASONE) 50 MG tablet        12/13/22 0015    EPINEPHrine 0.3 mg/0.3 mL IJ SOAJ injection  As needed        12/13/22 0015    cetirizine (ZYRTEC ALLERGY) 10 MG tablet  Daily PRN        12/13/22 0015             Note:  This document was prepared using Dragon voice recognition software and may include unintentional dictation errors.    Pilar Jarvis, MD 12/13/22 4098    Pilar Jarvis, MD 12/13/22 [phone]

## 2022-12-13 MED ORDER — EPINEPHRINE 0.3 MG/0.3ML IJ SOAJ: 0.3000 mg | INTRAMUSCULAR | 0 refills | Status: AC | PRN

## 2022-12-13 MED ORDER — PREDNISONE 50 MG PO TABS: ORAL_TABLET | ORAL | 0 refills | Status: AC

## 2022-12-13 MED ORDER — CETIRIZINE HCL 10 MG PO TABS
10.0000 mg | ORAL_TABLET | Freq: Every day | ORAL | 0 refills | Status: AC | PRN
Start: 2022-12-13 — End: 2023-01-12

## 2022-12-13 NOTE — ED Notes (Signed)
ED Provider at bedside. 

## 2022-12-26 IMAGING — DX DG CHEST 1V PORT
1 series · 1 of 1 positions shown · non-contrast
Comparison: 09/26/2012.

CLINICAL DATA: Weakness, headache, fever and chills.  Back pain.

EXAM:
PORTABLE CHEST 1 VIEW

[chest ap]
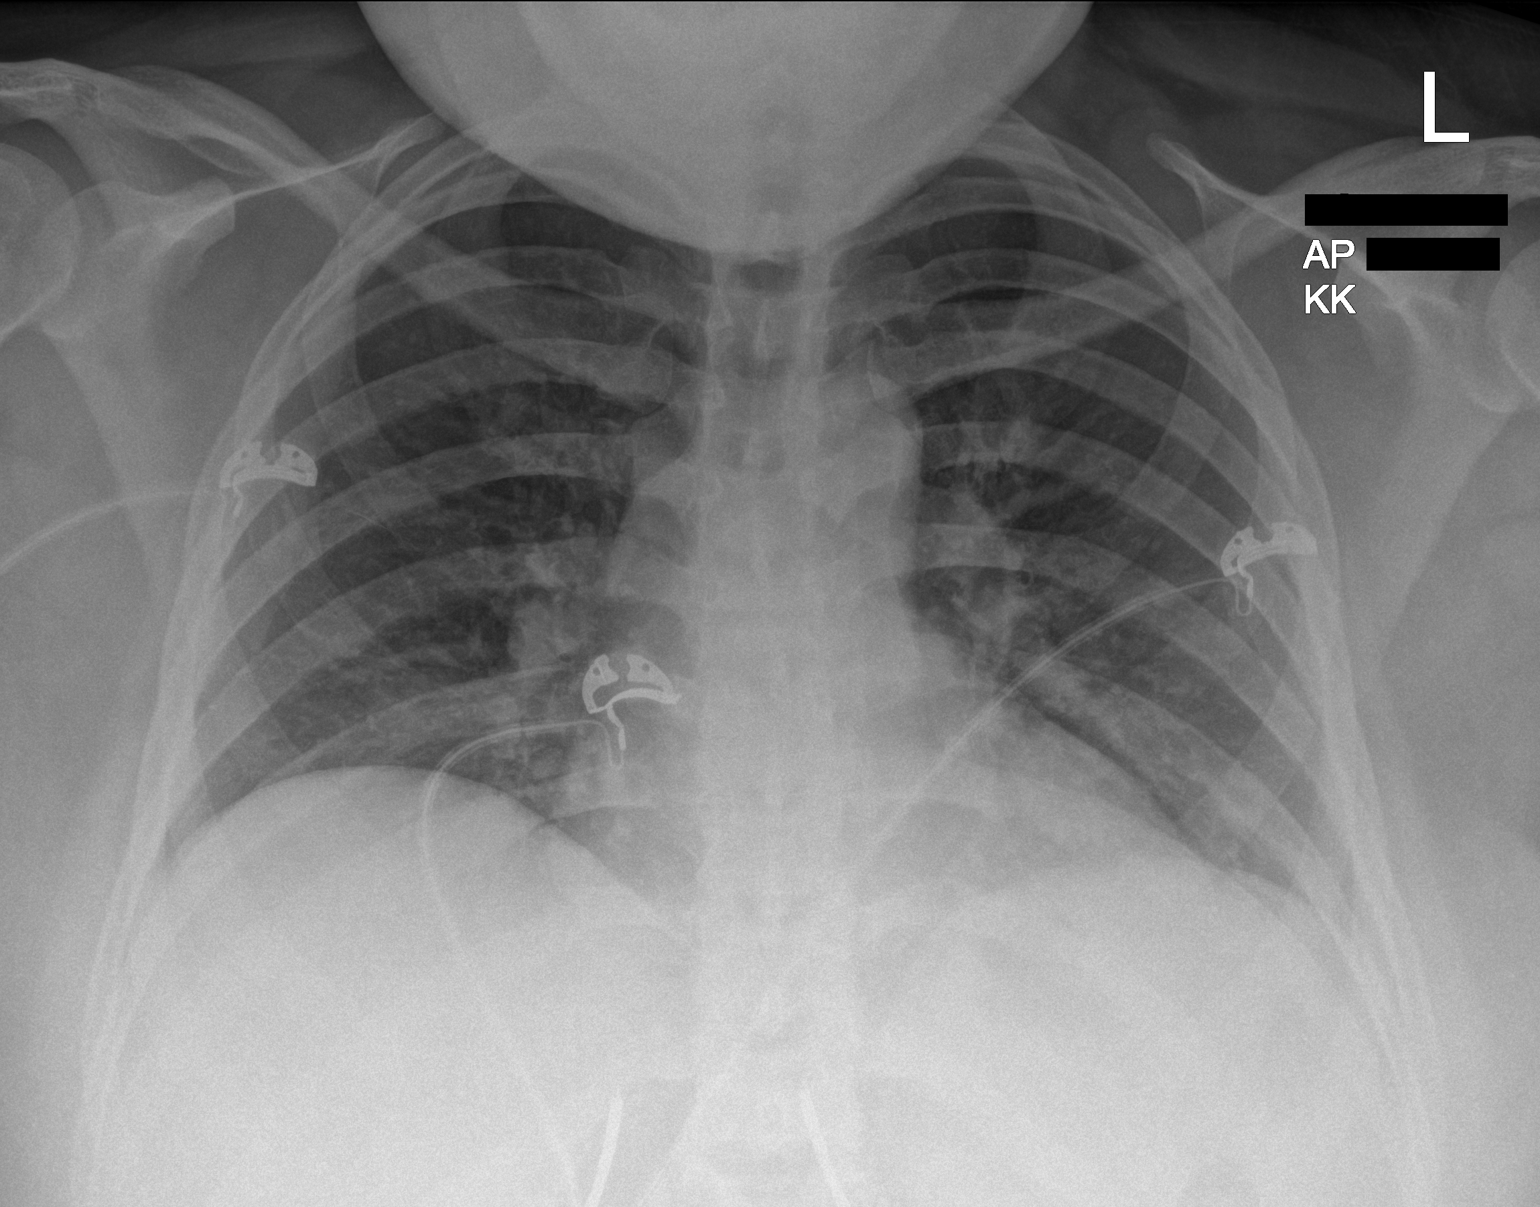

[1 of 1 positions shown; findings below may reference images not displayed]

FINDINGS: Normal heart, mediastinum and hila.

Clear lungs.  No pleural effusion or pneumothorax.

Skeletal structures grossly intact.
IMPRESSION: No active disease.

## 2022-12-26 IMAGING — CT CT CHEST W/ CM
2 of 4 series · 15 of 36 positions shown, 18 images · IV contrast (agent unspecified)
Comparison: None Available.

CLINICAL DATA: Fever of unknown origin.

EXAM:
CT CHEST WITH CONTRAST
TECHNIQUE: Multidetector CT imaging of the chest was performed during
intravenous contrast administration.

[Series 2: chest w · axial · 0.66mm/px · z∈[-706,-476]mm · 12 of 137 slices shown, 15 images]
[im 11/137  mediastinal]
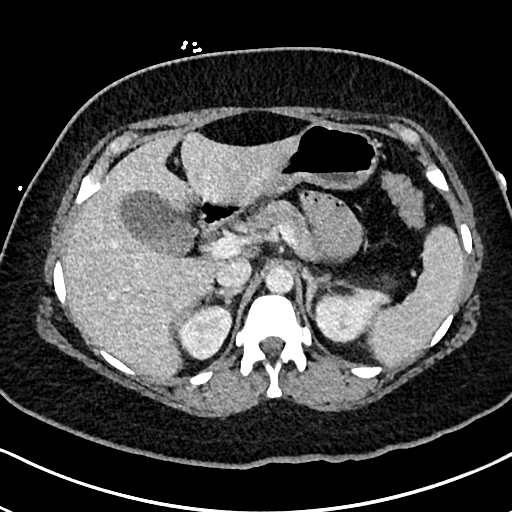
[im 11/137  lung]
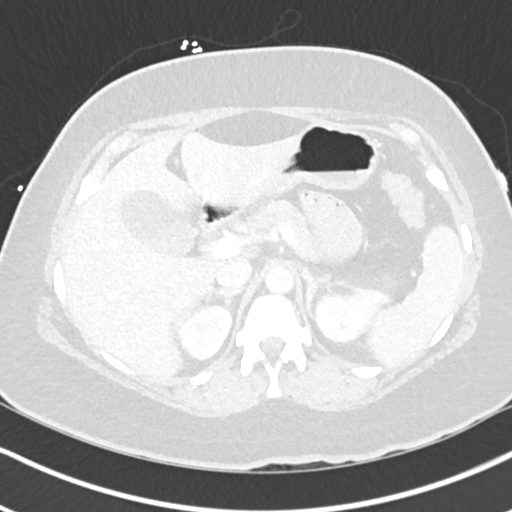
[im 21/137  lung]
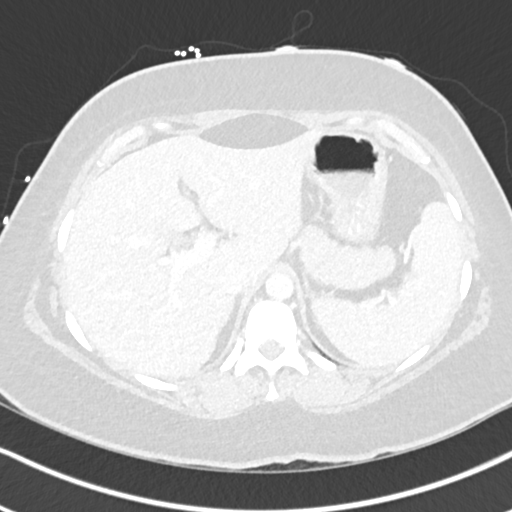
[im 32/137  lung]
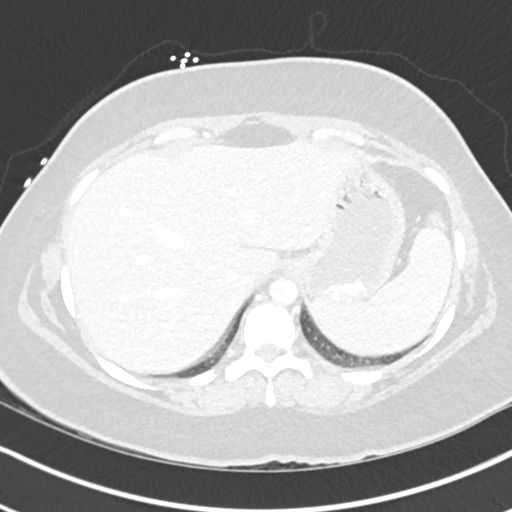
[im 42/137  lung]
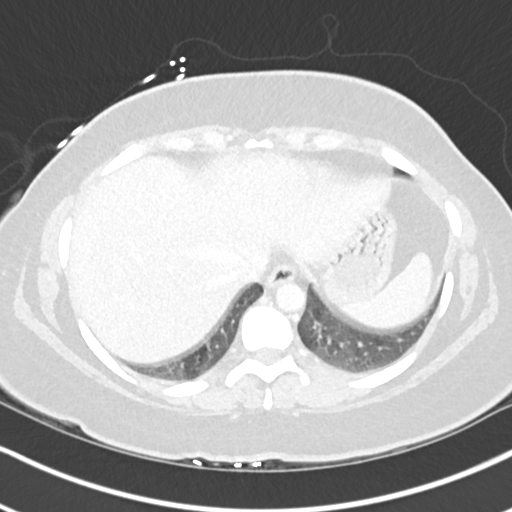
[im 53/137  mediastinal]
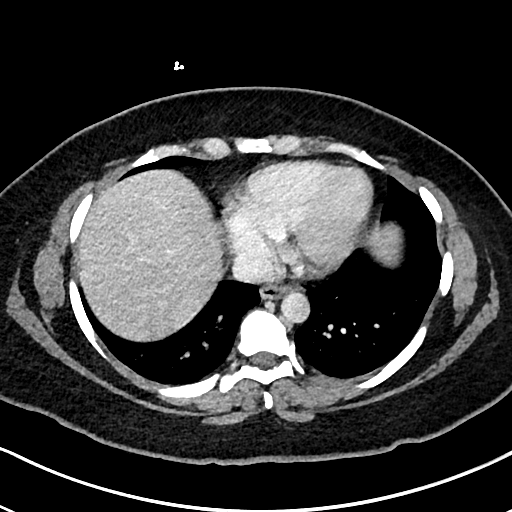
[im 53/137  lung]
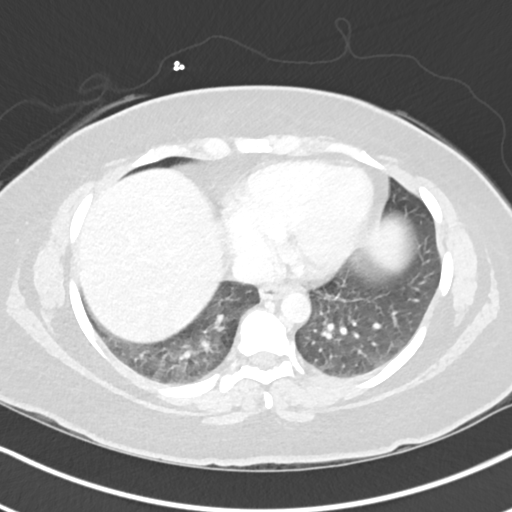
[im 63/137  lung]
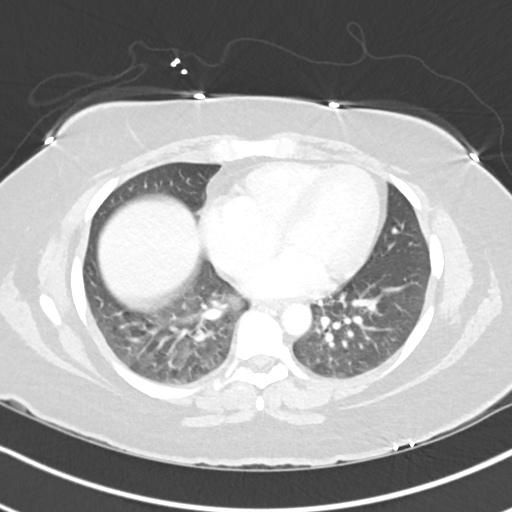
[im 74/137  lung]
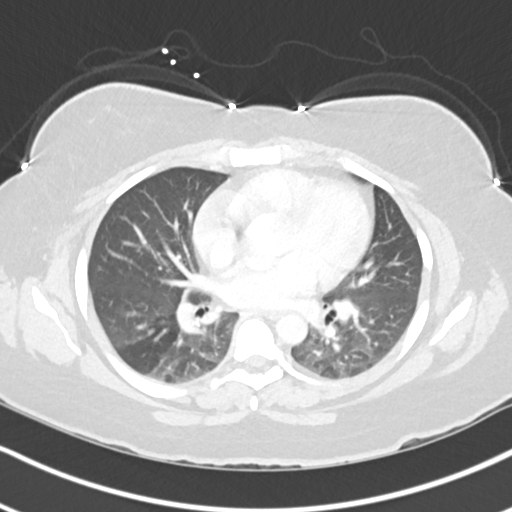
[im 84/137  lung]
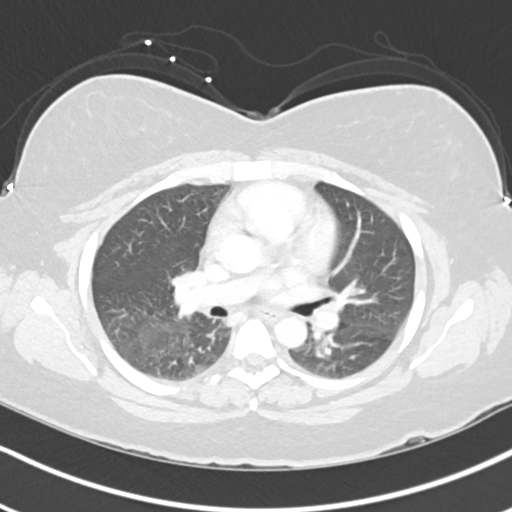
[im 95/137  mediastinal]
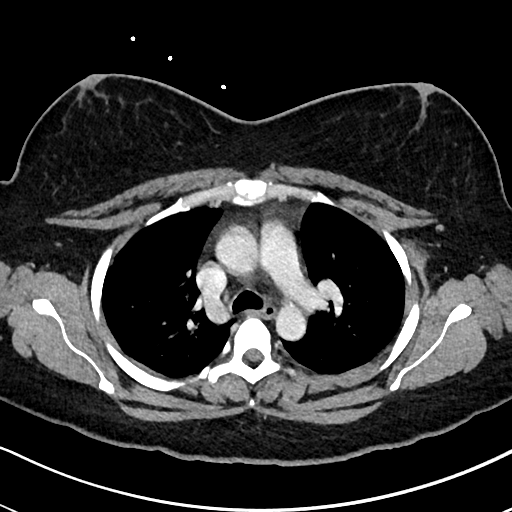
[im 95/137  lung]
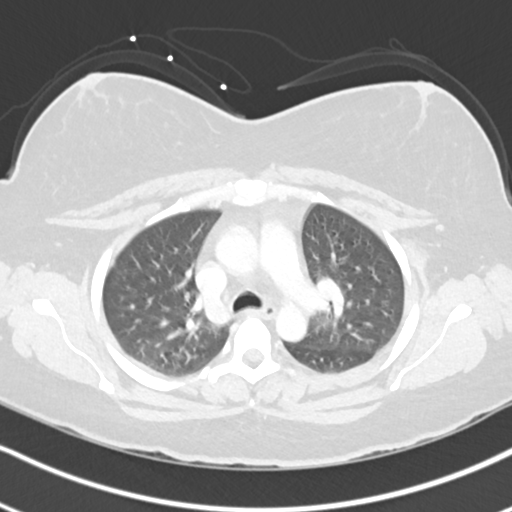
[im 105/137  lung]
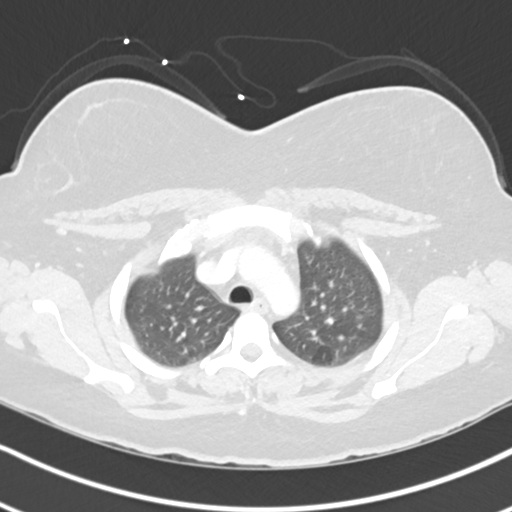
[im 116/137  lung]
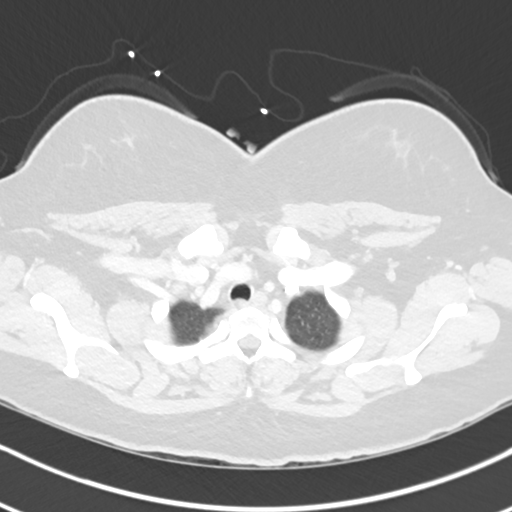
[im 126/137  lung]
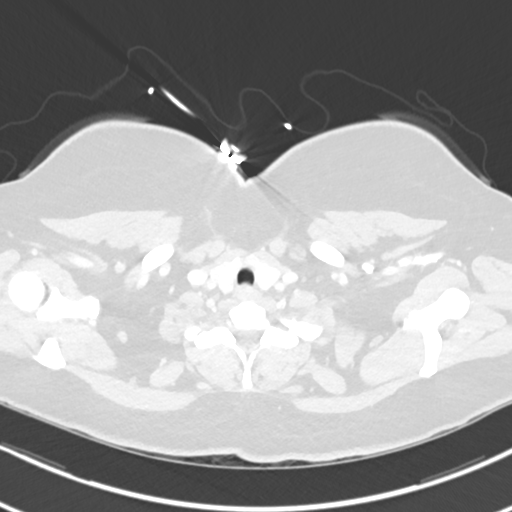

[Series 602: cor · coronal · 0.66mm/px · 3 of 138 slices shown]
[im 28/138  lung]
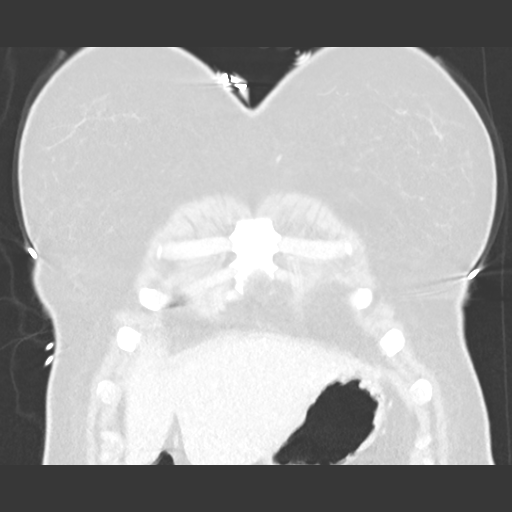
[im 55/138  lung]
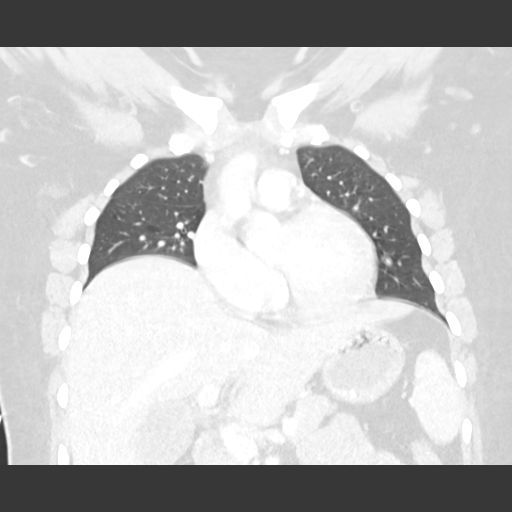
[im 83/138  lung]
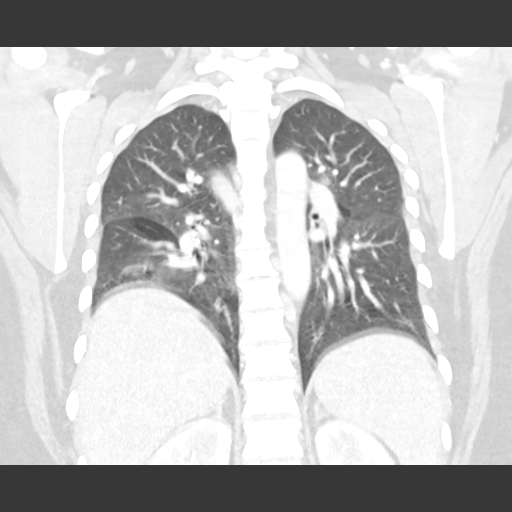

[15 of 36 positions shown; findings below may reference images not displayed]

RADIATION DOSE REDUCTION: This exam was performed according to the
departmental dose-optimization program which includes automated
exposure control, adjustment of the mA and/or kV according to
patient size and/or use of iterative reconstruction technique.

CONTRAST:  75mL OMNIPAQUE IOHEXOL 300 MG/ML  SOLN
FINDINGS: Cardiovascular: The heart size is normal. No substantial pericardial
effusion. No thoracic aortic aneurysm.

Mediastinum/Nodes: No mediastinal lymphadenopathy. There is no hilar
lymphadenopathy. The esophagus has normal imaging features. There is
no axillary lymphadenopathy.

Lungs/Pleura: No evidence for pneumothorax or focal airspace
consolidation. No pleural effusion. Subtle mosaic attenuation in the
lower lobes is nonspecific but may be related to air trapping from
small airways disease. 4 mm left lower lobe pulmonary nodule seen on
abdomen CT earlier today is again identified on image 80/3. 3 mm
lingular nodule visible on 69/3.

Upper Abdomen: Unremarkable.

Musculoskeletal: No worrisome lytic or sclerotic osseous
abnormality.
IMPRESSION: 1. No acute findings in the chest. Specifically, no findings to
explain the patient's history of fever.
2. 4 mm left lower lobe pulmonary nodule with 3 mm lingular nodule.
No follow-up needed if patient is low-risk (and has no known or
suspected primary neoplasm). Non-contrast chest CT can be considered
in 12 months if patient is high-risk. This recommendation follows
the consensus statement: Guidelines for Management of Incidental
Pulmonary Nodules Detected on CT Images: From the [HOSPITAL]

## 2023-02-26 ENCOUNTER — Other Ambulatory Visit: Payer: Self-pay

## 2023-02-26 ENCOUNTER — Emergency Department
Admission: EM | Admit: 2023-02-26 | Discharge: 2023-02-26 | Disposition: A | Discharge status: Home / Self Care | Payer: Worker's Compensation | Attending: Emergency Medicine | Admitting: Emergency Medicine

## 2023-02-26 DIAGNOSIS — Z23 Encounter for immunization: Secondary | ICD-10-CM | POA: Insufficient documentation

## 2023-02-26 DIAGNOSIS — Y990 Civilian activity done for income or pay: Secondary | ICD-10-CM | POA: Insufficient documentation

## 2023-02-26 DIAGNOSIS — W268XXA Contact with other sharp object(s), not elsewhere classified, initial encounter: Secondary | ICD-10-CM | POA: Insufficient documentation

## 2023-02-26 DIAGNOSIS — S51812A Laceration without foreign body of left forearm, initial encounter: Secondary | ICD-10-CM | POA: Insufficient documentation

## 2023-02-26 DIAGNOSIS — S59912A Unspecified injury of left forearm, initial encounter: Secondary | ICD-10-CM | POA: Diagnosis present

## 2023-02-26 MED ORDER — OXYCODONE HCL 5 MG PO TABS: 5.0000 mg | ORAL_TABLET | Freq: Three times a day (TID) | ORAL | 0 refills | Status: AC | PRN

## 2023-02-26 MED ORDER — OXYCODONE-ACETAMINOPHEN 5-325 MG PO TABS
1.0000 | ORAL_TABLET | Freq: Once | ORAL | Status: AC
  Administered 2023-02-26: 1 via ORAL
  Filled 2023-02-26: qty 1

## 2023-02-26 MED ORDER — SULFAMETHOXAZOLE-TRIMETHOPRIM 800-160 MG PO TABS
1.0000 | ORAL_TABLET | Freq: Once | ORAL | Status: AC
  Administered 2023-02-26: 1 via ORAL
  Filled 2023-02-26: qty 1

## 2023-02-26 MED ORDER — LIDOCAINE-EPINEPHRINE 1 %-1:100000 IJ SOLN
30.0000 mL | Freq: Once | INTRAMUSCULAR | Status: AC
  Administered 2023-02-26: 30 mL
  Filled 2023-02-26: qty 2

## 2023-02-26 MED ORDER — SULFAMETHOXAZOLE-TRIMETHOPRIM 800-160 MG PO TABS: 1.0000 | ORAL_TABLET | Freq: Two times a day (BID) | ORAL | 0 refills | Status: AC

## 2023-02-26 MED ORDER — TETANUS-DIPHTH-ACELL PERTUSSIS 5-2.5-18.5 LF-MCG/0.5 IM SUSY
0.5000 mL | PREFILLED_SYRINGE | Freq: Once | INTRAMUSCULAR | Status: AC
  Administered 2023-02-26: 0.5 mL via INTRAMUSCULAR
  Filled 2023-02-26: qty 0.5

## 2023-02-26 NOTE — ED Provider Notes (Signed)
[place] EMERGENCY DEPARTMENT AT [facility]ION[facility] Provider Note   CSN: [id number] Arrival date & time: 02/26/23  1459     History  Chief Complaint  Patient presents with   Laceration    Krista Farley is a 32 y.o. female.  Presents with laceration to the left volar aspect of the forearm from a box cutter that occurred at work earlier today.  Tetanus updated here in the ED.  No numbness or tingling.  She has full active flexion extension of the digits.  HPI     Home Medications Prior to Admission medications   Medication Sig Start Date End Date Taking? Authorizing Provider  oxyCODONE (ROXICODONE) 5 MG immediate release tablet Take 1 tablet (5 mg total) by mouth every 8 (eight) hours as needed for up to 5 days. 02/26/23 03/03/23 Yes Evon Slack, PA-C  sulfamethoxazole-trimethoprim (BACTRIM DS) 800-160 MG tablet Take 1 tablet by mouth 2 (two) times daily. 02/26/23  Yes Ronnette Juniper  [facility] ALLERGY 180 MG tablet Take 180 mg by mouth daily. 07/08/21   [provider]  cetirizine (ZYRTEC ALLERGY) 10 MG tablet Take 1 tablet (10 mg total) by mouth daily as needed for allergies. 12/13/22 01/12/23  Pilar Jarvis, MD  cyclobenzaprine (FLEXERIL) 5 MG tablet Take 1 tablet (5 mg total) by mouth 3 (three) times daily as needed. 11/05/21   Menshew, Charlesetta Ivory, PA-C  EPINEPHrine 0.3 mg/0.3 mL IJ SOAJ injection Inject 0.3 mg into the muscle as needed for anaphylaxis. 12/13/22   Pilar Jarvis, MD  ISOtretinoin (ACCUTANE) 30 MG capsule Take 2 capsules (60 mg total) by mouth daily. Take with food 10/28/21   Deirdre Evener, MD  norethindrone-ethinyl estradiol (MICROGESTIN) 1-20 MG-MCG tablet Take 1 tablet by mouth daily. 08/21/21   Moye, [place], MD  ondansetron (ZOFRAN-ODT) 4 MG disintegrating tablet Take 1 tablet (4 mg total) by mouth every 8 (eight) hours as needed for nausea or vomiting. 11/05/21   Menshew, Charlesetta Ivory, PA-C  predniSONE (DELTASONE) 50 MG tablet Take 1  tablet daily for the next 4 days starting on 12/14/2022. 12/13/22   Pilar Jarvis, MD      Allergies    Patient has no known allergies.    Review of Systems   Review of Systems  Physical Exam Updated Vital Signs BP (!) 130/94   Pulse 85   Temp 98 F (36.7 C)   Resp 18   SpO2 100%  Physical Exam Constitutional:      Appearance: She is well-developed.  HENT:     Head: Normocephalic and atraumatic.  Eyes:     Conjunctiva/sclera: Conjunctivae normal.  Cardiovascular:     Rate and Rhythm: Normal rate.  Pulmonary:     Effort: Pulmonary effort is normal. No respiratory distress.  Musculoskeletal:        General: Normal range of motion.     Cervical back: Normal range of motion.     Comments: Left hand with full active flexion and extension of the digits.  She has full sensation that is intact throughout the left upper extremity.  2+ radial pulse.  2+ cap refill.  She has a 8 cm laceration linear superficial.  Bleeding well-controlled.  Skin:    General: Skin is warm.     Findings: No rash.  Neurological:     Mental Status: She is alert and oriented to person, place, and time.  Psychiatric:        Behavior: Behavior normal.  Thought Content: Thought content normal.     ED Results / Procedures / Treatments   Labs (all labs ordered are listed, but only abnormal results are displayed) Labs Reviewed - No data to display  EKG None  Radiology No results found.  Procedures .Marland KitchenLaceration Repair  Date/Time: 02/26/2023 6:57 PM  Performed by: Evon Slack, PA-C Authorized by: Evon Slack, PA-C   Consent:    Consent obtained:  Verbal Anesthesia:    Anesthesia method:  Local infiltration   Local anesthetic:  Lidocaine 1% WITH epi Laceration details:    Location:  Shoulder/arm   Shoulder/arm location:  L lower arm   Length (cm):  8   Depth (mm):  3 Treatment:    Area cleansed with:  Povidone-iodine and saline   Amount of cleaning:  Extensive   Irrigation  solution:  Sterile water   Irrigation volume:  100   Irrigation method:  Pressure wash and tap Skin repair:    Repair method:  Sutures   Suture size:  4-0   Suture material:  Fast-absorbing gut   Suture technique:  Subcuticular   Number of sutures:  12 Approximation:    Approximation:  Close Repair type:    Repair type:  Intermediate Post-procedure details:    Dressing:  Non-adherent dressing   Procedure completion:  Tolerated     Medications Ordered in ED Medications  sulfamethoxazole-trimethoprim (BACTRIM DS) 800-160 MG per tablet 1 tablet (has no administration in time range)  oxyCODONE-acetaminophen (PERCOCET/ROXICET) 5-325 MG per tablet 1 tablet (1 tablet Oral Given 02/26/23 1731)  Tdap (BOOSTRIX) injection 0.5 mL (0.5 mLs Intramuscular Given 02/26/23 1732)  lidocaine-EPINEPHrine (XYLOCAINE W/EPI) 1 %-1:100000 (with pres) injection 30 mL (30 mLs Infiltration Given 02/26/23 1734)    ED Course/ Medical Decision Making/ A&P                                 Medical Decision Making Risk Prescription drug management.   32 year old female with forearm laceration, repaired with absorbable sutures subcuticularly.  Dermabond was also applied.  She was placed on antibiotics.  She is given pain medication.  She is educated on wound care and she understands signs symptoms return to the ER for.  She has no neurological deficit on exam.  There is no numbness tingling or loss of flexion or extension Final Clinical Impression(s) / ED Diagnoses Final diagnoses:  Laceration of left forearm, initial encounter    Rx / DC Orders ED Discharge Orders          Ordered    sulfamethoxazole-trimethoprim (BACTRIM DS) 800-160 MG tablet  2 times daily        02/26/23 1854    oxyCODONE (ROXICODONE) 5 MG immediate release tablet  Every 8 hours PRN        02/26/23 1854              Evon Slack, PA-C 02/26/23 1859    Merwyn Katos, MD 02/26/23 1946

## 2023-02-26 NOTE — ED Triage Notes (Signed)
Pt comes from work with c/o laceration to left forearm. Pt has bandage in place but able to see bleeding through. Pt states some nausea.

## 2023-02-26 NOTE — Discharge Instructions (Signed)
Keep laceration site clean and dry for 3 days.  After 3 days you may shower and get wet.  Allow Dermabond to come off on its own.  Take antibiotics as prescribed.  Return to the ER for any worsening symptoms or any urgent changes in health

## 2023-03-01 NOTE — Group Note (Deleted)
